# Patient Record
Sex: Female | Born: 1950 | Race: White | Hispanic: No | Marital: Married | Smoking: Never smoker
Health system: Southern US, Community
[De-identification: ages and names within clinical notes are randomized; demographics above are authoritative.]

## PROBLEM LIST (undated history)

## (undated) DIAGNOSIS — Z8589 Personal history of malignant neoplasm of other organs and systems: Secondary | ICD-10-CM

## (undated) DIAGNOSIS — R42 Dizziness and giddiness: Secondary | ICD-10-CM

## (undated) DIAGNOSIS — L570 Actinic keratosis: Secondary | ICD-10-CM

## (undated) DIAGNOSIS — D369 Benign neoplasm, unspecified site: Secondary | ICD-10-CM

## (undated) DIAGNOSIS — Z973 Presence of spectacles and contact lenses: Secondary | ICD-10-CM

## (undated) DIAGNOSIS — E782 Mixed hyperlipidemia: Secondary | ICD-10-CM

## (undated) DIAGNOSIS — I10 Essential (primary) hypertension: Secondary | ICD-10-CM

## (undated) DIAGNOSIS — J4520 Mild intermittent asthma, uncomplicated: Secondary | ICD-10-CM

## (undated) DIAGNOSIS — I4510 Unspecified right bundle-branch block: Secondary | ICD-10-CM

## (undated) DIAGNOSIS — E538 Deficiency of other specified B group vitamins: Secondary | ICD-10-CM

## (undated) DIAGNOSIS — J45909 Unspecified asthma, uncomplicated: Secondary | ICD-10-CM

## (undated) DIAGNOSIS — H9191 Unspecified hearing loss, right ear: Secondary | ICD-10-CM

## (undated) HISTORY — PX: COLONOSCOPY: SHX174

## (undated) HISTORY — DX: Actinic keratosis: L57.0

## (undated) HISTORY — PX: TUBAL LIGATION: SHX77

## (undated) HISTORY — DX: Personal history of malignant neoplasm of other organs and systems: Z85.89

---

## 2005-07-10 ENCOUNTER — Ambulatory Visit: Payer: Self-pay | Admitting: Internal Medicine

## 2006-07-30 ENCOUNTER — Ambulatory Visit: Payer: Self-pay | Admitting: Internal Medicine

## 2007-01-17 ENCOUNTER — Ambulatory Visit: Payer: Self-pay | Admitting: Gastroenterology

## 2007-08-28 ENCOUNTER — Ambulatory Visit: Payer: Self-pay | Admitting: Internal Medicine

## 2008-10-12 ENCOUNTER — Ambulatory Visit: Payer: Self-pay | Admitting: Internal Medicine

## 2009-10-27 ENCOUNTER — Ambulatory Visit: Payer: Self-pay | Admitting: Internal Medicine

## 2010-10-31 ENCOUNTER — Ambulatory Visit: Payer: Self-pay | Admitting: Internal Medicine

## 2012-04-04 ENCOUNTER — Ambulatory Visit: Payer: Self-pay | Admitting: Internal Medicine

## 2012-06-11 DIAGNOSIS — Z8589 Personal history of malignant neoplasm of other organs and systems: Secondary | ICD-10-CM

## 2012-06-11 HISTORY — DX: Personal history of malignant neoplasm of other organs and systems: Z85.89

## 2012-07-15 ENCOUNTER — Ambulatory Visit: Payer: Self-pay | Admitting: Gastroenterology

## 2013-04-07 ENCOUNTER — Ambulatory Visit: Payer: Self-pay | Admitting: Internal Medicine

## 2014-04-08 ENCOUNTER — Ambulatory Visit: Payer: Self-pay | Admitting: Internal Medicine

## 2015-03-10 ENCOUNTER — Other Ambulatory Visit: Payer: Self-pay | Admitting: Internal Medicine

## 2015-03-10 DIAGNOSIS — Z1231 Encounter for screening mammogram for malignant neoplasm of breast: Secondary | ICD-10-CM

## 2015-04-11 ENCOUNTER — Ambulatory Visit
Admission: RE | Admit: 2015-04-11 | Discharge: 2015-04-11 | Disposition: A | Discharge status: Home / Self Care | Payer: PPO | Source: Ambulatory Visit | Attending: Internal Medicine | Admitting: Internal Medicine

## 2015-04-11 DIAGNOSIS — Z1231 Encounter for screening mammogram for malignant neoplasm of breast: Secondary | ICD-10-CM | POA: Diagnosis not present

## 2015-05-10 ENCOUNTER — Emergency Department
Admission: EM | Admit: 2015-05-10 | Discharge: 2015-05-11 | Disposition: A | Discharge status: Home / Self Care | Payer: PPO | Attending: Emergency Medicine | Admitting: Emergency Medicine

## 2015-05-10 ENCOUNTER — Encounter: Payer: Self-pay | Admitting: *Deleted

## 2015-05-10 ENCOUNTER — Emergency Department: Payer: PPO

## 2015-05-10 ENCOUNTER — Other Ambulatory Visit: Payer: Self-pay

## 2015-05-10 DIAGNOSIS — R0602 Shortness of breath: Secondary | ICD-10-CM | POA: Insufficient documentation

## 2015-05-10 DIAGNOSIS — I10 Essential (primary) hypertension: Secondary | ICD-10-CM | POA: Diagnosis not present

## 2015-05-10 DIAGNOSIS — R0600 Dyspnea, unspecified: Secondary | ICD-10-CM | POA: Diagnosis present

## 2015-05-10 LAB — CBC WITH DIFFERENTIAL/PLATELET
BASOS ABS: 0 10*3/uL (ref 0–0.1)
Basophils Relative: 0 %
EOS ABS: 0.1 10*3/uL (ref 0–0.7)
Eosinophils Relative: 1 %
HEMATOCRIT: 39.5 % (ref 35.0–47.0)
Hemoglobin: 13.5 g/dL (ref 12.0–16.0)
LYMPHS ABS: 1.9 10*3/uL (ref 1.0–3.6)
Lymphocytes Relative: 20 %
MCH: 28.2 pg (ref 26.0–34.0)
MCHC: 34.2 g/dL (ref 32.0–36.0)
MCV: 82.3 fL (ref 80.0–100.0)
MONO ABS: 0.6 10*3/uL (ref 0.2–0.9)
MONOS PCT: 7 %
NEUTROS PCT: 72 %
Neutro Abs: 6.8 10*3/uL — ABNORMAL HIGH (ref 1.4–6.5)
PLATELETS: 189 10*3/uL (ref 150–440)
RBC: 4.8 MIL/uL (ref 3.80–5.20)
RDW: 14.1 % (ref 11.5–14.5)
WBC: 9.5 10*3/uL (ref 3.6–11.0)

## 2015-05-10 LAB — COMPREHENSIVE METABOLIC PANEL
ALT: 21 U/L (ref 14–54)
AST: 20 U/L (ref 15–41)
Albumin: 4.3 g/dL (ref 3.5–5.0)
Alkaline Phosphatase: 64 U/L (ref 38–126)
Anion gap: 9 (ref 5–15)
BUN: 23 mg/dL — ABNORMAL HIGH (ref 6–20)
CO2: 24 mmol/L (ref 22–32)
CREATININE: 0.92 mg/dL (ref 0.44–1.00)
Calcium: 9 mg/dL (ref 8.9–10.3)
Chloride: 105 mmol/L (ref 101–111)
GFR calc Af Amer: >60 mL/min (ref >60)
Glucose, Bld: 113 mg/dL — ABNORMAL HIGH (ref 65–99)
POTASSIUM: 3.9 mmol/L (ref 3.5–5.1)
Sodium: 138 mmol/L (ref 135–145)
Total Bilirubin: 0.6 mg/dL (ref 0.3–1.2)
Total Protein: 6.8 g/dL (ref 6.5–8.1)

## 2015-05-10 LAB — FIBRIN DERIVATIVES D-DIMER (ARMC ONLY): Fibrin derivatives D-dimer (ARMC): 389 (ref 0–499)

## 2015-05-10 LAB — TROPONIN I

## 2015-05-10 MED ORDER — ALBUTEROL SULFATE (2.5 MG/3ML) 0.083% IN NEBU
5.0000 mg | INHALATION_SOLUTION | Freq: Once | RESPIRATORY_TRACT | Status: AC
  Administered 2015-05-10: 2.5 mg via RESPIRATORY_TRACT

## 2015-05-10 MED ORDER — IPRATROPIUM-ALBUTEROL 18-103 MCG/ACT IN AERO: 2.0000 | INHALATION_SPRAY | Freq: Four times a day (QID) | RESPIRATORY_TRACT | Status: DC

## 2015-05-10 NOTE — ED Notes (Signed)
Pt ambulatory to triage.  Pt states twice today she had episode of diff breathing .Marland Kitchen States it feels like a blanket over over me and i can't breathe.  No chest pain.  Denies numbness.  No weakness.  No n/v/d.   Pt reports that i just don't feel well today.

## 2015-05-10 NOTE — ED Provider Notes (Addendum)
CSN: [id number]     Arrival date & time 05/10/15  2114 History   First MD Initiated Contact with Patient 05/10/15 2129     Chief Complaint  Patient presents with  . Respiratory Distress     (Consider location/radiation/quality/duration/timing/severity/associated sxs/prior Treatment) The history is provided by the patient.  Allison Shelton is a 64 y.o. female history of hypertension here presenting with shortness of breath. Patient states that she recently drove back from [place]. Patient states that she had 2 episode with she states that she can't catch her breath today. During dinnertime, she feels full and doesn't want to eat. Has some shortness of breath that lasted several minutes. Denies any chest pain. No history of PE or DVT. Denies any cardiac history. Had similar symptoms several years ago and was given inhaler and felt better.    No past medical history on file. No past surgical history on file. No family history on file. History  Substance Use Topics  . Smoking status: Never Smoker   . Smokeless tobacco: Not on file  . Alcohol Use: No   OB History    No data available     Review of Systems  Respiratory: Positive for shortness of breath.   All other systems reviewed and are negative.     Allergies  Morphine and related; Penicillins; and Sulfa antibiotics  Home Medications   Prior to Admission medications   Not on File   BP 136/70 mmHg  Pulse 70  Temp(Src) 98.3 F (36.8 C) (Oral)  Resp 20  Ht 4\' 11"  (1.499 m)  Wt 190 lb (86.183 kg)  BMI 38.35 kg/m2  SpO2 99% Physical Exam  Constitutional: She is oriented to person, place, and time. She appears well-developed and well-nourished.  HENT:  Head: Normocephalic.  Mouth/Throat: Oropharynx is clear and moist.  Eyes: Conjunctivae are normal. Pupils are equal, round, and reactive to light.  Neck: Normal range of motion. Neck supple.  Cardiovascular: Normal rate, regular rhythm and normal heart sounds.    Pulmonary/Chest: Effort normal and breath sounds normal.  Dec air movement. ? Minimal wheezing, no retractions  Abdominal: Soft. Bowel sounds are normal. She exhibits no distension. There is no tenderness. There is no rebound.  Musculoskeletal: Normal range of motion. She exhibits no edema or tenderness.  Neurological: She is alert and oriented to person, place, and time. No cranial nerve deficit. Coordination normal.  Skin: Skin is warm and dry.  Psychiatric: She has a normal mood and affect. Her behavior is normal. Judgment and thought content normal.  Nursing note and vitals reviewed.   ED Course  Procedures (including critical care time) Labs Review Labs Reviewed  CBC WITH DIFFERENTIAL/PLATELET - Abnormal; Notable for the following:    Neutro Abs 6.8 (*)    All other components within normal limits  COMPREHENSIVE METABOLIC PANEL - Abnormal; Notable for the following:    Glucose, Bld 113 (*)    BUN 23 (*)    All other components within normal limits  TROPONIN I  FIBRIN DERIVATIVES D-DIMER [facility] ONLY)    Imaging Review Dg Chest 2 View  05/10/2015   CLINICAL DATA:  Difficulty taking a deep breath since noon today  EXAM: CHEST  2 VIEW  COMPARISON:  None.  FINDINGS: The heart size and mediastinal contours are within normal limits. Both lungs are clear. The visualized skeletal structures are unremarkable.  IMPRESSION: No active cardiopulmonary disease.   Electronically Signed   By: Skipper Cliche M.D.   On:  05/10/2015 22:04     EKG Interpretation None       ED ECG REPORT   Date: 05/10/2015  EKG Time: 11:10 PM  Rate: 70  Rhythm: normal sinus rhythm,  normal EKG, normal sinus rhythm  Axis: normal  Intervals:none  ST&T Change: none  Narrative Interpretation:              MDM   Final diagnoses:  None    Allison Shelton is a 64 y.o. female here with shortness of breath. Consider asthma vs PE (given recent travel). No chest pain and I doubt ACS. Will get labs,  d-dimer, CXR. Will give neb and reassess.   11:10 PM CXR nl, labs unremarkable. D-dimer pending. Signed out to Dr. Owens Shark. Anticipate Dc if d-dimer neg. Can dc home with albuterol prn.     Wandra Arthurs, MD 05/10/15 Braymer Maxyne Derocher, MD 05/10/15 [phone]

## 2015-05-10 NOTE — ED Notes (Signed)
Patient presents with no real complaints other than "I am just not feeling myself." Patient states, "I have no real symptoms. I dont know how to describe it. I just feel strange." Patient denies CP, SOB, and ABD pain. Patient goes on to request to have "at least my blood pressure and blood sugar checked". NAD noted at this time.

## 2015-05-10 NOTE — Discharge Instructions (Signed)
Take albuterol as needed for cough or shortness of breath.   Follow up with your doctor.  Return to ER if you have worse shortness of breath, cough, wheezing, chest pain.

## 2015-05-11 NOTE — ED Provider Notes (Signed)
I assumed care of the patient at 11:00 PM from Dr. Darl Householder. D-dimer 389 which is normal patient's rest or distress resolved. We will discharge home as per recommendations from Dr. Darl Householder.  Gregor Hams, MD 05/11/15 [phone]

## 2015-10-27 DIAGNOSIS — Z85828 Personal history of other malignant neoplasm of skin: Secondary | ICD-10-CM | POA: Diagnosis not present

## 2015-10-27 DIAGNOSIS — L821 Other seborrheic keratosis: Secondary | ICD-10-CM | POA: Diagnosis not present

## 2015-10-27 DIAGNOSIS — L820 Inflamed seborrheic keratosis: Secondary | ICD-10-CM | POA: Diagnosis not present

## 2016-03-05 DIAGNOSIS — R739 Hyperglycemia, unspecified: Secondary | ICD-10-CM | POA: Diagnosis not present

## 2016-03-05 DIAGNOSIS — Z0000 Encounter for general adult medical examination without abnormal findings: Secondary | ICD-10-CM | POA: Diagnosis not present

## 2016-03-12 DIAGNOSIS — Z0000 Encounter for general adult medical examination without abnormal findings: Secondary | ICD-10-CM | POA: Diagnosis not present

## 2016-03-13 ENCOUNTER — Other Ambulatory Visit: Payer: Self-pay | Admitting: Internal Medicine

## 2016-03-13 DIAGNOSIS — Z1231 Encounter for screening mammogram for malignant neoplasm of breast: Secondary | ICD-10-CM

## 2016-04-11 ENCOUNTER — Ambulatory Visit
Admission: RE | Admit: 2016-04-11 | Discharge: 2016-04-11 | Disposition: A | Discharge status: Home / Self Care | Payer: PPO | Source: Ambulatory Visit | Attending: Internal Medicine | Admitting: Internal Medicine

## 2016-04-11 ENCOUNTER — Other Ambulatory Visit: Payer: Self-pay | Admitting: Internal Medicine

## 2016-04-11 DIAGNOSIS — Z1231 Encounter for screening mammogram for malignant neoplasm of breast: Secondary | ICD-10-CM

## 2016-04-16 ENCOUNTER — Other Ambulatory Visit: Payer: Self-pay | Admitting: Internal Medicine

## 2016-04-16 DIAGNOSIS — R928 Other abnormal and inconclusive findings on diagnostic imaging of breast: Secondary | ICD-10-CM

## 2016-05-02 ENCOUNTER — Ambulatory Visit
Admission: RE | Admit: 2016-05-02 | Discharge: 2016-05-02 | Disposition: A | Discharge status: Home / Self Care | Payer: PPO | Source: Ambulatory Visit | Attending: Internal Medicine | Admitting: Internal Medicine

## 2016-05-02 DIAGNOSIS — N6489 Other specified disorders of breast: Secondary | ICD-10-CM | POA: Diagnosis not present

## 2016-05-02 DIAGNOSIS — R928 Other abnormal and inconclusive findings on diagnostic imaging of breast: Secondary | ICD-10-CM | POA: Insufficient documentation

## 2016-07-20 ENCOUNTER — Other Ambulatory Visit (HOSPITAL_COMMUNITY): Payer: Self-pay | Admitting: Podiatry

## 2016-07-20 ENCOUNTER — Other Ambulatory Visit: Payer: Self-pay | Admitting: Podiatry

## 2016-07-20 DIAGNOSIS — M79671 Pain in right foot: Secondary | ICD-10-CM | POA: Diagnosis not present

## 2016-07-20 DIAGNOSIS — D489 Neoplasm of uncertain behavior, unspecified: Secondary | ICD-10-CM | POA: Diagnosis not present

## 2016-08-02 ENCOUNTER — Ambulatory Visit: Payer: PPO

## 2016-08-06 DIAGNOSIS — Z85828 Personal history of other malignant neoplasm of skin: Secondary | ICD-10-CM | POA: Diagnosis not present

## 2016-08-06 DIAGNOSIS — D1800 Hemangioma unspecified site: Secondary | ICD-10-CM | POA: Diagnosis not present

## 2016-08-06 DIAGNOSIS — L578 Other skin changes due to chronic exposure to nonionizing radiation: Secondary | ICD-10-CM | POA: Diagnosis not present

## 2016-08-06 DIAGNOSIS — D229 Melanocytic nevi, unspecified: Secondary | ICD-10-CM | POA: Diagnosis not present

## 2016-08-06 DIAGNOSIS — L812 Freckles: Secondary | ICD-10-CM | POA: Diagnosis not present

## 2016-08-06 DIAGNOSIS — L821 Other seborrheic keratosis: Secondary | ICD-10-CM | POA: Diagnosis not present

## 2016-08-06 DIAGNOSIS — Z1283 Encounter for screening for malignant neoplasm of skin: Secondary | ICD-10-CM | POA: Diagnosis not present

## 2016-08-06 DIAGNOSIS — I8393 Asymptomatic varicose veins of bilateral lower extremities: Secondary | ICD-10-CM | POA: Diagnosis not present

## 2016-08-06 DIAGNOSIS — L820 Inflamed seborrheic keratosis: Secondary | ICD-10-CM | POA: Diagnosis not present

## 2016-08-07 DIAGNOSIS — H8111 Benign paroxysmal vertigo, right ear: Secondary | ICD-10-CM | POA: Diagnosis not present

## 2016-08-13 ENCOUNTER — Ambulatory Visit
Admission: RE | Admit: 2016-08-13 | Discharge: 2016-08-13 | Disposition: A | Discharge status: Home / Self Care | Payer: PPO | Source: Ambulatory Visit | Attending: Podiatry | Admitting: Podiatry

## 2016-08-13 DIAGNOSIS — S96811A Strain of other specified muscles and tendons at ankle and foot level, right foot, initial encounter: Secondary | ICD-10-CM | POA: Diagnosis not present

## 2016-08-13 DIAGNOSIS — X58XXXA Exposure to other specified factors, initial encounter: Secondary | ICD-10-CM | POA: Diagnosis not present

## 2016-08-13 DIAGNOSIS — D489 Neoplasm of uncertain behavior, unspecified: Secondary | ICD-10-CM | POA: Diagnosis not present

## 2016-08-13 DIAGNOSIS — S86311A Strain of muscle(s) and tendon(s) of peroneal muscle group at lower leg level, right leg, initial encounter: Secondary | ICD-10-CM | POA: Diagnosis not present

## 2016-08-13 MED ORDER — GADOBENATE DIMEGLUMINE 529 MG/ML IV SOLN
15.0000 mL | Freq: Once | INTRAVENOUS | Status: AC | PRN
  Administered 2016-08-13: 15 mL via INTRAVENOUS

## 2016-08-20 DIAGNOSIS — Z23 Encounter for immunization: Secondary | ICD-10-CM | POA: Diagnosis not present

## 2016-08-23 DIAGNOSIS — D489 Neoplasm of uncertain behavior, unspecified: Secondary | ICD-10-CM | POA: Diagnosis not present

## 2016-08-29 NOTE — Discharge Instructions (Signed)
[facility] °[facility] ° °POST OPERATIVE INSTRUCTIONS FOR DR. TROXLER AND DR. FOWLER °[facility] ° ° °1. Take your medication as prescribed.  Pain medication should be taken only as needed. ° °2. Keep the dressing clean, dry and intact. ° °3. Keep your foot elevated above the heart level for the first 48 hours. ° °4. Walking to the bathroom and brief periods of walking are acceptable, unless we have instructed you to be non-weight bearing. ° °5. Always wear your post-op shoe when walking.  Always use your crutches if you are to be non-weight bearing. ° °6. Do not take a shower. Baths are permissible as long as the foot is kept out of the water.  ° °7. Every hour you are awake:  °- Bend your knee 15 times. °- Flex foot 15 times °- Massage calf 15 times ° °8. Call [facility] ([phone]) if any of the following problems occur: °- You develop a temperature or fever. °- The bandage becomes saturated with blood. °- Medication does not stop your pain. °- Injury of the foot occurs. °- Any symptoms of infection including redness, odor, or red streaks running from wound. ° °General Anesthesia, Adult, Care After °Refer to this sheet in the next few weeks. These instructions provide you with information on caring for yourself after your procedure. Your health care provider may also give you more specific instructions. Your treatment has been planned according to current medical practices, but problems sometimes occur. Call your health care provider if you have any problems or questions after your procedure. °WHAT TO EXPECT AFTER THE PROCEDURE °After the procedure, it is typical to experience: °· Sleepiness. °· Nausea and vomiting. °HOME CARE INSTRUCTIONS °· For the first 24 hours after general anesthesia: °¨ Have a responsible person with you. °¨ Do not drive a car. If you are alone, do not take public transportation. °¨ Do not drink alcohol. °¨ Do not take  medicine that has not been prescribed by your health care provider. °¨ Do not sign important papers or make important decisions. °¨ You may resume a normal diet and activities as directed by your health care provider. °· Change bandages (dressings) as directed. °· If you have questions or problems that seem related to general anesthesia, call the hospital and ask for the anesthetist or anesthesiologist on call. °SEEK MEDICAL CARE IF: °· You have nausea and vomiting that continue the day after anesthesia. °· You develop a rash. °SEEK IMMEDIATE MEDICAL CARE IF:  °· You have difficulty breathing. °· You have chest pain. °· You have any allergic problems. °  °This information is not intended to replace advice given to you by your health care provider. Make sure you discuss any questions you have with your health care provider. °  °Document Released: 01/14/2001 Document Revised: 10/29/2014 Document Reviewed: 02/06/2012 °Elsevier Interactive Patient Education ©2016 [employer] ° °

## 2016-08-30 ENCOUNTER — Encounter: Payer: Self-pay | Admitting: *Deleted

## 2016-09-05 ENCOUNTER — Encounter
Admission: RE | Disposition: A | Discharge status: Home / Self Care | Payer: Self-pay | Source: Ambulatory Visit | Attending: Podiatry

## 2016-09-05 ENCOUNTER — Ambulatory Visit: Payer: PPO | Admitting: Anesthesiology

## 2016-09-05 ENCOUNTER — Ambulatory Visit
Admission: RE | Admit: 2016-09-05 | Discharge: 2016-09-05 | Disposition: A | Discharge status: Home / Self Care | Payer: PPO | Source: Ambulatory Visit | Attending: Podiatry | Admitting: Podiatry

## 2016-09-05 DIAGNOSIS — D489 Neoplasm of uncertain behavior, unspecified: Secondary | ICD-10-CM | POA: Diagnosis not present

## 2016-09-05 DIAGNOSIS — D2121 Benign neoplasm of connective and other soft tissue of right lower limb, including hip: Secondary | ICD-10-CM | POA: Diagnosis not present

## 2016-09-05 DIAGNOSIS — R2242 Localized swelling, mass and lump, left lower limb: Secondary | ICD-10-CM | POA: Diagnosis not present

## 2016-09-05 DIAGNOSIS — I10 Essential (primary) hypertension: Secondary | ICD-10-CM | POA: Insufficient documentation

## 2016-09-05 DIAGNOSIS — D492 Neoplasm of unspecified behavior of bone, soft tissue, and skin: Secondary | ICD-10-CM | POA: Diagnosis not present

## 2016-09-05 DIAGNOSIS — Z79899 Other long term (current) drug therapy: Secondary | ICD-10-CM | POA: Diagnosis not present

## 2016-09-05 DIAGNOSIS — H9191 Unspecified hearing loss, right ear: Secondary | ICD-10-CM | POA: Insufficient documentation

## 2016-09-05 DIAGNOSIS — M12271 Villonodular synovitis (pigmented), right ankle and foot: Secondary | ICD-10-CM | POA: Diagnosis not present

## 2016-09-05 HISTORY — PX: MASS EXCISION: SHX2000

## 2016-09-05 HISTORY — DX: Presence of spectacles and contact lenses: Z97.3

## 2016-09-05 HISTORY — DX: Unspecified hearing loss, right ear: H91.91

## 2016-09-05 HISTORY — DX: Essential (primary) hypertension: I10

## 2016-09-05 HISTORY — DX: Dizziness and giddiness: R42

## 2016-09-05 SURGERY — EXCISION MASS
Anesthesia: Monitor Anesthesia Care | Laterality: Right

## 2016-09-05 MED ORDER — PROPOFOL 500 MG/50ML IV EMUL
INTRAVENOUS | Status: DC | PRN
  Administered 2016-09-05: 50 ug/kg/min via INTRAVENOUS

## 2016-09-05 MED ORDER — OXYCODONE HCL 5 MG/5ML PO SOLN: 5.0000 mg | Freq: Once | ORAL | Status: DC | PRN

## 2016-09-05 MED ORDER — ONDANSETRON HCL 4 MG PO TABS: 4.0000 mg | ORAL_TABLET | Freq: Four times a day (QID) | ORAL | Status: DC | PRN

## 2016-09-05 MED ORDER — HYDROCODONE-ACETAMINOPHEN 5-325 MG PO TABS: 1.0000 | ORAL_TABLET | Freq: Four times a day (QID) | ORAL | 0 refills | Status: DC | PRN

## 2016-09-05 MED ORDER — LIDOCAINE HCL (CARDIAC) 20 MG/ML IV SOLN
INTRAVENOUS | Status: DC | PRN
  Administered 2016-09-05: 50 mg via INTRAVENOUS

## 2016-09-05 MED ORDER — OXYCODONE HCL 5 MG PO TABS: 5.0000 mg | ORAL_TABLET | Freq: Once | ORAL | Status: DC | PRN

## 2016-09-05 MED ORDER — MIDAZOLAM HCL 2 MG/2ML IJ SOLN
INTRAMUSCULAR | Status: DC | PRN
  Administered 2016-09-05: 2 mg via INTRAVENOUS

## 2016-09-05 MED ORDER — ONDANSETRON HCL 4 MG/2ML IJ SOLN: 4.0000 mg | Freq: Four times a day (QID) | INTRAMUSCULAR | Status: DC | PRN

## 2016-09-05 MED ORDER — BUPIVACAINE HCL (PF) 0.5 % IJ SOLN
INTRAMUSCULAR | Status: DC | PRN
  Administered 2016-09-05: 10 mL

## 2016-09-05 MED ORDER — FENTANYL CITRATE (PF) 100 MCG/2ML IJ SOLN
INTRAMUSCULAR | Status: DC | PRN
  Administered 2016-09-05 (×2): 50 ug via INTRAVENOUS

## 2016-09-05 MED ORDER — ONDANSETRON HCL 4 MG/2ML IJ SOLN: 4.0000 mg | Freq: Once | INTRAMUSCULAR | Status: DC | PRN

## 2016-09-05 MED ORDER — LIDOCAINE-EPINEPHRINE 1 %-1:100000 IJ SOLN
INTRAMUSCULAR | Status: DC | PRN
  Administered 2016-09-05: 5 mL

## 2016-09-05 MED ORDER — LACTATED RINGERS IV SOLN
INTRAVENOUS | Status: DC
  Administered 2016-09-05: 11:00:00 via INTRAVENOUS

## 2016-09-05 MED ORDER — HYDROCODONE-ACETAMINOPHEN 5-325 MG PO TABS: 1.0000 | ORAL_TABLET | ORAL | Status: DC | PRN

## 2016-09-05 MED ORDER — FENTANYL CITRATE (PF) 100 MCG/2ML IJ SOLN: 25.0000 ug | INTRAMUSCULAR | Status: DC | PRN

## 2016-09-05 SURGICAL SUPPLY — 37 items
BENZOIN TINCTURE PRP APPL 2/3 (GAUZE/BANDAGES/DRESSINGS) ×2 IMPLANT
BNDG ESMARK 6X12 TAN STRL LF (GAUZE/BANDAGES/DRESSINGS) ×2 IMPLANT
BNDG GAUZE 4.5X4.1 6PLY STRL (MISCELLANEOUS) ×2 IMPLANT
BNDG STRETCH 4X75 STRL LF (GAUZE/BANDAGES/DRESSINGS) ×2 IMPLANT
CANISTER SUCT 1200ML W/VALVE (MISCELLANEOUS) ×2 IMPLANT
CUFF TOURN SGL QUICK 18 (TOURNIQUET CUFF) IMPLANT
DURAPREP 26ML APPLICATOR (WOUND CARE) ×2 IMPLANT
GAUZE PETRO XEROFOAM 1X8 (MISCELLANEOUS) ×2 IMPLANT
GAUZE PETRO XEROFOAM 5X9 (MISCELLANEOUS) IMPLANT
GAUZE SPONGE 4X4 12PLY STRL (GAUZE/BANDAGES/DRESSINGS) ×2 IMPLANT
GLOVE BIO SURGEON STRL SZ7.5 (GLOVE) ×2 IMPLANT
GLOVE INDICATOR 8.0 STRL GRN (GLOVE) ×2 IMPLANT
GOWN STRL REUS W/ TWL LRG LVL3 (GOWN DISPOSABLE) ×2 IMPLANT
GOWN STRL REUS W/TWL LRG LVL3 (GOWN DISPOSABLE) ×2
KIT ROOM TURNOVER OR (KITS) ×2 IMPLANT
NS IRRIG 500ML POUR BTL (IV SOLUTION) ×2 IMPLANT
PACK EXTREMITY ARMC (MISCELLANEOUS) ×2 IMPLANT
PAD GROUND ADULT SPLIT (MISCELLANEOUS) ×2 IMPLANT
STOCKINETTE STRL 6IN 960660 (GAUZE/BANDAGES/DRESSINGS) ×2 IMPLANT
STRIP CLOSURE SKIN 1/4X4 (GAUZE/BANDAGES/DRESSINGS) ×2 IMPLANT
SUT ETHILON 4-0 (SUTURE) ×1
SUT ETHILON 4-0 FS2 18XMFL BLK (SUTURE) ×1
SUT ETHILON 5-0 FS-2 18 BLK (SUTURE) IMPLANT
SUT MNCRL 4-0 (SUTURE)
SUT MNCRL 4-0 27XMFL (SUTURE)
SUT MNCRL 5-0+ PC-1 (SUTURE) IMPLANT
SUT MONOCRYL 5-0 (SUTURE)
SUT PDS 2-0 27IN (SUTURE) ×2 IMPLANT
SUT VIC AB 0 CT1 36 (SUTURE) IMPLANT
SUT VIC AB 2-0 SH 27 (SUTURE)
SUT VIC AB 2-0 SH 27XBRD (SUTURE) IMPLANT
SUT VIC AB 3-0 SH 27 (SUTURE)
SUT VIC AB 3-0 SH 27X BRD (SUTURE) IMPLANT
SUT VIC AB 4-0 FS2 27 (SUTURE) ×2 IMPLANT
SUT VICRYL AB 3-0 FS1 BRD 27IN (SUTURE) IMPLANT
SUTURE ETHLN 4-0 FS2 18XMF BLK (SUTURE) ×1 IMPLANT
SUTURE MNCRL 4-0 27XMF (SUTURE) IMPLANT

## 2016-09-05 NOTE — H&P (Signed)
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  The various methods of treatment have been discussed with the patient.  No guarantees were given.  After consideration of risks, benefits and other options for treatment, the patient has consented to surgery.  I have reviewed the patients' chart and labs.    Patient Vitals for the past 24 hrs:  BP Temp Temp src Pulse Resp SpO2 Height Weight  09/05/16 1108 (!) 163/68 97.8 F (36.6 C) Temporal 73 16 98 % 4\' 11"  (1.499 m) 77.1 kg (170 lb)    A history and physical examination was performed in my office.  The patient was reexamined.  There have been no changes to this history and physical examination.  Samara Deist A

## 2016-09-05 NOTE — Transfer of Care (Signed)
Immediate Anesthesia Transfer of Care Note  Patient: Allison Shelton  Procedure(s) Performed: Procedure(s) with comments: EXCISION MASS BIOPSY DEEP RIGHT ANKLE (Right) - IV WITH LOCAL  Patient Location: PACU  Anesthesia Type: MAC  Level of Consciousness: awake, alert  and patient cooperative  Airway and Oxygen Therapy: Patient Spontanous Breathing and Patient connected to supplemental oxygen  Post-op Assessment: Post-op Vital signs reviewed, Patient's Cardiovascular Status Stable, Respiratory Function Stable, Patent Airway and No signs of Nausea or vomiting  Post-op Vital Signs: Reviewed and stable  Complications: No apparent anesthesia complications

## 2016-09-05 NOTE — Anesthesia Postprocedure Evaluation (Signed)
Anesthesia Post Note  Patient: Allison Shelton  Procedure(s) Performed: Procedure(s) (LRB): EXCISION MASS BIOPSY DEEP RIGHT ANKLE (Right)  Patient location during evaluation: PACU Anesthesia Type: MAC Level of consciousness: awake and alert Pain management: pain level controlled Vital Signs Assessment: post-procedure vital signs reviewed and stable Respiratory status: spontaneous breathing, nonlabored ventilation, respiratory function stable and patient connected to nasal cannula oxygen Cardiovascular status: stable and blood pressure returned to baseline Anesthetic complications: no    Alisa Graff

## 2016-09-05 NOTE — Op Note (Signed)
Operative note   Surgeon:Jermane Brayboy    Assistant:none    Preop diagnosis:soft tissue mass right lateral ankle    Postop diagnosis:same    Procedure:open biopsy soft tissue mass posterio-lateral right ankle    XO:[id number]     Anesthesia:local and IV sedation    Hemostasis: Epinephrine 1 100,000 infiltrated along the incision site    Specimen: Incisional biopsy soft tissue mass posterior lateral right ankle    Complications: None    Operative indications:Allison Shelton is an 65 y.o. that presents today for surgical intervention.  The risks/benefits/alternatives/complications have been discussed and consent has been given.    Procedure:  Patient was brought into the OR and placed on the operating table in thesupine position. After anesthesia was obtained theright lower extremity was prepped and draped in usual sterile fashion.  After sterile prep and drape attention was directed to the posterior lateral aspect of the right ankle where a longitudinal incision was made overlying the palpable soft tissue mass. Sharp and blunt dissection carried down to the mass. This looks to be a well encapsulated mass overlying the peroneal tendon regions. The wedge was created within the mass and removed from the surgical field in toto. Bleeders were Bovie cauterized at this time. A 2-0 PDS was placed on the proximal and distal aspect of the incisional site. The capsule was closed with a 4-0 Vicryl and the subcutaneous tissue closed with a 4-0 Vicryl. The skin closed with a 4-0 nylon. A well compressive sterile dressing was applied after placement of 0.5% Marcaine.    Patient tolerated the procedure and anesthesia well.  Was transported from the OR to the PACU with all vital signs stable and vascular status intact. To be discharged per routine protocol.  Will follow up in approximately 1 week in the outpatient clinic.

## 2016-09-05 NOTE — Anesthesia Preprocedure Evaluation (Signed)
Anesthesia Evaluation  Patient identified by MRN, date of birth, ID band Patient awake    Reviewed: Allergy & Precautions, H&P , NPO status , Patient's Chart, lab work & pertinent test results, reviewed documented beta blocker date and time   Airway Mallampati: II  TM Distance: >3 FB Neck ROM: full    Dental no notable dental hx.    Pulmonary neg pulmonary ROS,    Pulmonary exam normal breath sounds clear to auscultation       Cardiovascular Exercise Tolerance: Good hypertension,  Rhythm:regular Rate:Normal     Neuro/Psych Hard of hearing -right ear negative psych ROS   GI/Hepatic negative GI ROS, Neg liver ROS,   Endo/Other  negative endocrine ROS  Renal/GU negative Renal ROS  negative genitourinary   Musculoskeletal   Abdominal   Peds  Hematology negative hematology ROS (+)   Anesthesia Other Findings   Reproductive/Obstetrics negative OB ROS                             Anesthesia Physical Anesthesia Plan  ASA: II  Anesthesia Plan: MAC   Post-op Pain Management:    Induction:   Airway Management Planned:   Additional Equipment:   Intra-op Plan:   Post-operative Plan:   Informed Consent: I have reviewed the patients History and Physical, chart, labs and discussed the procedure including the risks, benefits and alternatives for the proposed anesthesia with the patient or authorized representative who has indicated his/her understanding and acceptance.   Dental Advisory Given  Plan Discussed with: CRNA  Anesthesia Plan Comments:         Anesthesia Quick Evaluation

## 2016-09-06 ENCOUNTER — Encounter: Payer: Self-pay | Admitting: Podiatry

## 2016-09-07 LAB — SURGICAL PATHOLOGY

## 2016-11-20 ENCOUNTER — Encounter: Payer: Self-pay | Admitting: *Deleted

## 2016-11-20 DIAGNOSIS — S86311D Strain of muscle(s) and tendon(s) of peroneal muscle group at lower leg level, right leg, subsequent encounter: Secondary | ICD-10-CM | POA: Diagnosis not present

## 2016-11-20 DIAGNOSIS — M1220 Villonodular synovitis (pigmented), unspecified site: Secondary | ICD-10-CM | POA: Diagnosis not present

## 2016-11-28 ENCOUNTER — Ambulatory Visit
Admission: RE | Admit: 2016-11-28 | Discharge: 2016-11-28 | Disposition: A | Discharge status: Home / Self Care | Payer: PPO | Source: Ambulatory Visit | Attending: Podiatry | Admitting: Podiatry

## 2016-11-28 ENCOUNTER — Ambulatory Visit: Payer: PPO | Admitting: Anesthesiology

## 2016-11-28 ENCOUNTER — Encounter
Admission: RE | Disposition: A | Discharge status: Home / Self Care | Payer: Self-pay | Source: Ambulatory Visit | Attending: Podiatry

## 2016-11-28 DIAGNOSIS — I10 Essential (primary) hypertension: Secondary | ICD-10-CM | POA: Diagnosis not present

## 2016-11-28 DIAGNOSIS — M65871 Other synovitis and tenosynovitis, right ankle and foot: Secondary | ICD-10-CM | POA: Diagnosis not present

## 2016-11-28 DIAGNOSIS — D489 Neoplasm of uncertain behavior, unspecified: Secondary | ICD-10-CM | POA: Diagnosis not present

## 2016-11-28 DIAGNOSIS — M12271 Villonodular synovitis (pigmented), right ankle and foot: Secondary | ICD-10-CM | POA: Diagnosis not present

## 2016-11-28 DIAGNOSIS — M1220 Villonodular synovitis (pigmented), unspecified site: Secondary | ICD-10-CM | POA: Diagnosis not present

## 2016-11-28 DIAGNOSIS — M25571 Pain in right ankle and joints of right foot: Secondary | ICD-10-CM | POA: Diagnosis not present

## 2016-11-28 DIAGNOSIS — M7671 Peroneal tendinitis, right leg: Secondary | ICD-10-CM | POA: Diagnosis not present

## 2016-11-28 DIAGNOSIS — M24871 Other specific joint derangements of right ankle, not elsewhere classified: Secondary | ICD-10-CM | POA: Diagnosis not present

## 2016-11-28 DIAGNOSIS — S86311D Strain of muscle(s) and tendon(s) of peroneal muscle group at lower leg level, right leg, subsequent encounter: Secondary | ICD-10-CM | POA: Diagnosis not present

## 2016-11-28 DIAGNOSIS — G8918 Other acute postprocedural pain: Secondary | ICD-10-CM | POA: Diagnosis not present

## 2016-11-28 HISTORY — PX: MASS EXCISION: SHX2000

## 2016-11-28 HISTORY — PX: TENOTOMY / FLEXOR TENDON TRANSFER: SHX6629

## 2016-11-28 SURGERY — TENOTOMY / FLEXOR TENDON TRANSFER
Anesthesia: Regional | Laterality: Right | Wound class: Clean

## 2016-11-28 MED ORDER — ACETAMINOPHEN 325 MG PO TABS: 325.0000 mg | ORAL_TABLET | ORAL | Status: DC | PRN

## 2016-11-28 MED ORDER — ONDANSETRON HCL 4 MG/2ML IJ SOLN: 4.0000 mg | Freq: Once | INTRAMUSCULAR | Status: DC | PRN

## 2016-11-28 MED ORDER — LIDOCAINE-EPINEPHRINE 2 %-1:100000 IJ SOLN
INTRAMUSCULAR | Status: DC | PRN
  Administered 2016-11-28: 7 mL

## 2016-11-28 MED ORDER — LACTATED RINGERS IV SOLN
INTRAVENOUS | Status: DC
  Administered 2016-11-28: 12:00:00 via INTRAVENOUS

## 2016-11-28 MED ORDER — GLYCOPYRROLATE 0.2 MG/ML IJ SOLN
INTRAMUSCULAR | Status: DC | PRN
  Administered 2016-11-28: 0.1 mg via INTRAVENOUS

## 2016-11-28 MED ORDER — ACETAMINOPHEN 160 MG/5ML PO SOLN: 325.0000 mg | ORAL | Status: DC | PRN

## 2016-11-28 MED ORDER — ONDANSETRON HCL 4 MG PO TABS: 4.0000 mg | ORAL_TABLET | Freq: Four times a day (QID) | ORAL | Status: DC | PRN

## 2016-11-28 MED ORDER — MIDAZOLAM HCL 2 MG/2ML IJ SOLN
INTRAMUSCULAR | Status: DC | PRN
  Administered 2016-11-28 (×2): 1 mg via INTRAVENOUS

## 2016-11-28 MED ORDER — FENTANYL CITRATE (PF) 100 MCG/2ML IJ SOLN
INTRAMUSCULAR | Status: DC | PRN
  Administered 2016-11-28: 50 ug via INTRAVENOUS

## 2016-11-28 MED ORDER — DEXTROSE 5 % IV SOLN
900.0000 mg | Freq: Once | INTRAVENOUS | Status: AC
  Administered 2016-11-28: 900 mg via INTRAVENOUS

## 2016-11-28 MED ORDER — PROPOFOL 10 MG/ML IV BOLUS
INTRAVENOUS | Status: DC | PRN
Start: 2016-11-28 — End: 2016-11-28
  Administered 2016-11-28: 120 mg via INTRAVENOUS

## 2016-11-28 MED ORDER — ONDANSETRON HCL 4 MG/2ML IJ SOLN: 4.0000 mg | Freq: Four times a day (QID) | INTRAMUSCULAR | Status: DC | PRN

## 2016-11-28 MED ORDER — LIDOCAINE HCL (CARDIAC) 20 MG/ML IV SOLN
INTRAVENOUS | Status: DC | PRN
  Administered 2016-11-28: 40 mg via INTRATRACHEAL

## 2016-11-28 MED ORDER — FENTANYL CITRATE (PF) 100 MCG/2ML IJ SOLN: 25.0000 ug | INTRAMUSCULAR | Status: DC | PRN

## 2016-11-28 MED ORDER — OXYCODONE HCL 5 MG PO TABS: 5.0000 mg | ORAL_TABLET | Freq: Once | ORAL | Status: DC | PRN

## 2016-11-28 MED ORDER — ONDANSETRON HCL 4 MG/2ML IJ SOLN
INTRAMUSCULAR | Status: DC | PRN
  Administered 2016-11-28: 4 mg via INTRAVENOUS

## 2016-11-28 MED ORDER — ROPIVACAINE HCL 5 MG/ML IJ SOLN
INTRAMUSCULAR | Status: DC | PRN
  Administered 2016-11-28: 35 mL via PERINEURAL

## 2016-11-28 MED ORDER — OXYCODONE HCL 5 MG/5ML PO SOLN: 5.0000 mg | Freq: Once | ORAL | Status: DC | PRN

## 2016-11-28 MED ORDER — DEXAMETHASONE SODIUM PHOSPHATE 4 MG/ML IJ SOLN
INTRAMUSCULAR | Status: DC | PRN
  Administered 2016-11-28: 4 mg via INTRAVENOUS

## 2016-11-28 SURGICAL SUPPLY — 62 items
BANDAGE ELASTIC 4 LF NS (GAUZE/BANDAGES/DRESSINGS) ×2 IMPLANT
BENZOIN TINCTURE PRP APPL 2/3 (GAUZE/BANDAGES/DRESSINGS) ×2 IMPLANT
BLADE MINI RND TIP GREEN BEAV (BLADE) IMPLANT
BLADE SURG 15 STRL LF DISP TIS (BLADE) IMPLANT
BLADE SURG 15 STRL SS (BLADE)
BNDG COHESIVE 4X5 TAN STRL (GAUZE/BANDAGES/DRESSINGS) ×2 IMPLANT
BNDG ESMARK 4X12 TAN STRL LF (GAUZE/BANDAGES/DRESSINGS) ×2 IMPLANT
BNDG GAUZE 4.5X4.1 6PLY STRL (MISCELLANEOUS) ×2 IMPLANT
BNDG STRETCH 4X75 STRL LF (GAUZE/BANDAGES/DRESSINGS) ×2 IMPLANT
CANISTER SUCT 1200ML W/VALVE (MISCELLANEOUS) ×2 IMPLANT
COVER LIGHT HANDLE UNIVERSAL (MISCELLANEOUS) ×4 IMPLANT
CUFF TOURN SGL QUICK 18 (TOURNIQUET CUFF) ×2 IMPLANT
DURAPREP 26ML APPLICATOR (WOUND CARE) ×2 IMPLANT
ETHIBOND 2 0 GREEN CT 2 30IN (SUTURE) ×2 IMPLANT
GAUZE PETRO XEROFOAM 1X8 (MISCELLANEOUS) ×2 IMPLANT
GAUZE PETRO XEROFOAM 5X9 (MISCELLANEOUS) IMPLANT
GAUZE SPONGE 4X4 12PLY STRL (GAUZE/BANDAGES/DRESSINGS) ×2 IMPLANT
GLOVE BIO SURGEON STRL SZ7.5 (GLOVE) ×2 IMPLANT
GLOVE INDICATOR 8.0 STRL GRN (GLOVE) ×2 IMPLANT
GOWN STRL REUS W/ TWL LRG LVL3 (GOWN DISPOSABLE) ×2 IMPLANT
GOWN STRL REUS W/ TWL XL LVL3 (GOWN DISPOSABLE) ×1 IMPLANT
GOWN STRL REUS W/TWL LRG LVL3 (GOWN DISPOSABLE) ×2
GOWN STRL REUS W/TWL XL LVL3 (GOWN DISPOSABLE) ×1
IV NS 250ML (IV SOLUTION)
IV NS 250ML BAXH (IV SOLUTION) IMPLANT
K-WIRE DBL END TROCAR 6X.045 (WIRE)
K-WIRE DBL END TROCAR 6X.062 (WIRE)
KIT ROOM TURNOVER OR (KITS) ×2 IMPLANT
KWIRE DBL END TROCAR 6X.045 (WIRE) IMPLANT
KWIRE DBL END TROCAR 6X.062 (WIRE) IMPLANT
NS IRRIG 500ML POUR BTL (IV SOLUTION) ×2 IMPLANT
PACK EXTREMITY ARMC (MISCELLANEOUS) ×2 IMPLANT
PAD GROUND ADULT SPLIT (MISCELLANEOUS) ×2 IMPLANT
PADDING CAST BLEND 4X4 NS (MISCELLANEOUS) ×2 IMPLANT
RASP SM TEAR CROSS CUT (RASP) IMPLANT
SPLINT CAST 1 STEP 4X30 (MISCELLANEOUS) IMPLANT
SPLINT FAST PLASTER 5X30 (CAST SUPPLIES)
SPLINT PLASTER CAST FAST 5X30 (CAST SUPPLIES) IMPLANT
STOCKINETTE IMPERVIOUS LG (DRAPES) ×2 IMPLANT
STOCKINETTE STRL 6IN 960660 (GAUZE/BANDAGES/DRESSINGS) ×2 IMPLANT
STRAP BODY AND KNEE 60X3 (MISCELLANEOUS) ×2 IMPLANT
STRIP CLOSURE SKIN 1/4X4 (GAUZE/BANDAGES/DRESSINGS) ×2 IMPLANT
SUT ETHILON 4-0 (SUTURE)
SUT ETHILON 4-0 FS2 18XMFL BLK (SUTURE)
SUT ETHILON 5-0 FS-2 18 BLK (SUTURE) IMPLANT
SUT MNCRL 4-0 (SUTURE)
SUT MNCRL 4-0 27XMFL (SUTURE)
SUT MNCRL 5-0+ PC-1 (SUTURE) IMPLANT
SUT MONOCRYL 5-0 (SUTURE)
SUT SILK 2 0 (SUTURE)
SUT SILK 2-0 30XBRD TIE 12 (SUTURE) IMPLANT
SUT VIC AB 0 CT1 36 (SUTURE) IMPLANT
SUT VIC AB 2-0 SH 27 (SUTURE) ×1
SUT VIC AB 2-0 SH 27XBRD (SUTURE) ×1 IMPLANT
SUT VIC AB 3-0 SH 27 (SUTURE)
SUT VIC AB 3-0 SH 27X BRD (SUTURE) IMPLANT
SUT VIC AB 4-0 FS2 27 (SUTURE) ×2 IMPLANT
SUT VICRYL AB 3-0 FS1 BRD 27IN (SUTURE) IMPLANT
SUTURE ETHLN 4-0 FS2 18XMF BLK (SUTURE) IMPLANT
SUTURE MNCRL 4-0 27XMF (SUTURE) IMPLANT
WAND TENDON TOPAZ 0 ANGL (MISCELLANEOUS) IMPLANT
WAND TOPAZ MICRO DEBRIDER (MISCELLANEOUS) ×2 IMPLANT

## 2016-11-28 NOTE — Anesthesia Postprocedure Evaluation (Addendum)
Anesthesia Post Note  Patient: Allison Shelton  Procedure(s) Performed: Procedure(s) (LRB): FLEXOR TENDON REPAIR--RIGHT GENERAL W/ POPLITEAL (Right) EXCISION--SOFT TISSUE MASS--RIGHT (Right)  Patient location during evaluation: PACU Anesthesia Type: Regional and General Level of consciousness: awake and alert and oriented Pain management: satisfactory to patient Vital Signs Assessment: post-procedure vital signs reviewed and stable Respiratory status: spontaneous breathing, nonlabored ventilation and respiratory function stable Cardiovascular status: blood pressure returned to baseline and stable Postop Assessment: Adequate PO intake and No signs of nausea or vomiting Anesthetic complications: no    Raliegh Ip

## 2016-11-28 NOTE — Anesthesia Preprocedure Evaluation (Signed)
Anesthesia Evaluation  Patient identified by MRN, date of birth, ID band Patient awake    Reviewed: Allergy & Precautions, H&P , NPO status , Patient's Chart, lab work & pertinent test results  Airway Mallampati: II  TM Distance: >3 FB Neck ROM: full    Dental no notable dental hx.    Pulmonary    Pulmonary exam normal        Cardiovascular hypertension, Normal cardiovascular exam     Neuro/Psych    GI/Hepatic   Endo/Other    Renal/GU      Musculoskeletal   Abdominal   Peds  Hematology   Anesthesia Other Findings   Reproductive/Obstetrics                             Anesthesia Physical Anesthesia Plan  ASA: II  Anesthesia Plan: General LMA and Regional   Post-op Pain Management: GA combined w/ Regional for post-op pain   Induction:   Airway Management Planned:   Additional Equipment:   Intra-op Plan:   Post-operative Plan:   Informed Consent: I have reviewed the patients History and Physical, chart, labs and discussed the procedure including the risks, benefits and alternatives for the proposed anesthesia with the patient or authorized representative who has indicated his/her understanding and acceptance.     Plan Discussed with:   Anesthesia Plan Comments:         Anesthesia Quick Evaluation

## 2016-11-28 NOTE — Anesthesia Procedure Notes (Signed)
Procedure Name: LMA Insertion Date/Time: 11/28/2016 12:37 PM Performed by: Mayme Genta Pre-anesthesia Checklist: Patient identified, Emergency Drugs available, Suction available, Timeout performed and Patient being monitored Patient Re-evaluated:Patient Re-evaluated prior to inductionOxygen Delivery Method: Circle system utilized Preoxygenation: Pre-oxygenation with 100% oxygen Intubation Type: IV induction LMA: LMA inserted LMA Size: 4.0 Number of attempts: 1 Placement Confirmation: positive ETCO2 and breath sounds checked- equal and bilateral Tube secured with: Tape

## 2016-11-28 NOTE — Addendum Note (Signed)
Addendum  created 11/28/16 1614 by Ronelle Nigh, MD   Sign clinical note

## 2016-11-28 NOTE — Anesthesia Procedure Notes (Signed)
Anesthesia Regional Block:  Popliteal block  Pre-Anesthetic Checklist: ,, timeout performed, Correct Patient, Correct Site, Correct Laterality, Correct Procedure, Correct Position, site marked, Risks and benefits discussed,  Surgical consent,  Pre-op evaluation,  At surgeon's request and post-op pain management  Laterality: Right  Prep: chloraprep       Needles:  Injection technique: Single-shot  Needle Type: Echogenic Needle     Needle Length: 9cm 9 cm Needle Gauge: 21 and 21 G    Additional Needles:  Procedures: ultrasound guided (picture in chart) Popliteal block Narrative:  Start time: 11/28/2016 11:48 AM End time: 11/28/2016 11:58 AM Injection made incrementally with aspirations every 5 mL.  Performed by: Personally  Anesthesiologist: Ronelle Nigh  Additional Notes: Functioning IV was confirmed and monitors applied. Ultrasound guidance: relevant anatomy identified, needle position confirmed, local anesthetic spread visualized around nerve(s)., vascular puncture avoided.  Image printed for medical record.  Negative aspiration and no paresthesias; incremental administration of local anesthetic. The patient tolerated the procedure well. Vitals signes recorded in RN notes. 25 ml to popliteal, and 10 ml to adductor canal.

## 2016-11-28 NOTE — Transfer of Care (Signed)
Immediate Anesthesia Transfer of Care Note  Patient: Allison Shelton  Procedure(s) Performed: Procedure(s) with comments: FLEXOR TENDON REPAIR--RIGHT GENERAL W/ POPLITEAL (Right) - GENERAL W/POPLITEAL EXCISION--SOFT TISSUE MASS--RIGHT (Right)  Patient Location: PACU  Anesthesia Type: General LMA, Regional  Level of Consciousness: awake, alert  and patient cooperative  Airway and Oxygen Therapy: Patient Spontanous Breathing and Patient connected to supplemental oxygen  Post-op Assessment: Post-op Vital signs reviewed, Patient's Cardiovascular Status Stable, Respiratory Function Stable, Patent Airway and No signs of Nausea or vomiting  Post-op Vital Signs: Reviewed and stable  Complications: No apparent anesthesia complications

## 2016-11-28 NOTE — Op Note (Signed)
Operative note   Surgeon:Dalayah Deahl Lawyer: None    Preop diagnosis: 1. Posterior lateral ankle issue mass/villonodular synovitis 2. Peroneal brevis tendon tear. 3. Peroneal longus tenosynovitis    Postop diagnosis: Same    Procedure:1. Excision of large posterior lateral soft tissue mass 2. Repair of peroneal brevis tendon tear 3. Synovectomy and Tina lysis peroneal longus tenosynovitis    EBL: 10 ML's    Anesthesia:regional and general    Hemostasis: midcalf tourniquet inflated to 200 mmHg for proximally 70 minutes    Specimen: soft tissue mass posterior lateral right ankle/pigmented villonodular synovitis    Complications: none    Operative indications:Allison Shelton is an 66 y.o. that presents today for surgical intervention.  The risks/benefits/alternatives/complications have been discussed and consent has been given.    Procedure:  Patient was brought into the OR and placed on the operating table in thesupine position. After anesthesia was obtained theright lower extremity was prepped and draped in usual sterile fashion.  Attention was directed to the posterior lateral aspect of the right ankle. The site was infiltrated with 2% lidocaine with epinephrine. After inflation of the tourniquet a longitudinal incision was made from just proximal to the fifth metatarsal base to the posterior aspect of the ankle. Sharp and blunt dissection carried down to the tendon sheath and extensor retinaculum. Extensor retinaculum was incised. There was noted be a large soft tissue mass corporative within the peroneal sheath. The sheath was then incised and the soft tissue mass was noted. This was noted to be a fairly extensive soft tissue mass that coursed from the peroneal tendon groove of the fibula all the way to the posterior aspect of the ankle. Dissection was carried to the posterior aspect of the ankle. This was extensive dissection. I was able to remove what appears to be the total soft  tissue mass. This was sent for pathological examination. The wound was flushed with copious amounts or irrigation. The peroneal tendon was noted to have a tear at the peroneal groove. The torn piece was incised and the tendon was tubularized with a 4-0 Vicryl. The peroneal longus tendon had synovitis with no obvious tear. The Topaz wand was used to infiltrate along the peroneal brevis and longus tendon repair. The extensor retinaculum had been involved in the soft tissue mass but I was able to reanastomose this to the posterior aspect of the fibula with a 2-0 Ethibond suture in a pants over vest fashion. The peroneal tendon sheath was reanastomosed with a 4-0 Vicryl. The subcutaneous tissue closed with a 3-0 Vicryl and the skin closed with a 3-0 nylon. The area was then infiltrated with 0.25% Marcaine. A large bulky sterile dressing was applied and the patient was placed in an equalizer walker boot with the foot at 90.    Patient tolerated the procedure and anesthesia well.  Was transported from the OR to the PACU with all vital signs stable and vascular status intact. To be discharged per routine protocol.  Will follow up in approximately 1 week in the outpatient clinic.

## 2016-11-28 NOTE — Discharge Instructions (Signed)
[place] [place]   1. Take your medication as prescribed.  Pain medication should be taken only as needed.  2. Keep the dressing clean, dry and intact.  3. Keep your foot elevated above the heart level for the first 48 hours.  4. Walking to the bathroom and brief periods of walking are acceptable, unless we have instructed you to be non-weight bearing.  5. Always wear your post-op shoe when walking.  Always use your crutches if you are to be non-weight bearing.  6. Do not take a shower. Baths are permissible as long as the foot is kept out of the water.   7. Every hour you are awake:  - Bend your knee 15 times.  8. Call [facility] [phone]) if any of the following problems occur: - You develop a temperature or fever. - The bandage becomes saturated with blood. - Medication does not stop your pain. - Injury of the foot occurs. - Any symptoms of infection including redness, odor, or red streaks running from wound.   General Anesthesia, Adult, Care After These instructions provide you with information about caring for yourself after your procedure. Your health care provider may also give you more specific instructions. Your treatment has been planned according to current medical practices, but problems sometimes occur. Call your health care provider if you have any problems or questions after your procedure. What can I expect after the procedure? After the procedure, it is common to have:  Vomiting.  A sore throat.  Mental slowness. It is common to feel:  Nauseous.  Cold or shivery.  Sleepy.  Tired.  Sore or achy, even in parts of your body where you did not have surgery. Follow these instructions at home: For at least 24 hours after the procedure:  Do not:  Participate in activities where you could fall or become  injured.  Drive.  Use heavy machinery.  Drink alcohol.  Take sleeping pills or medicines that cause drowsiness.  Make important decisions or sign legal documents.  Take care of children on your own.  Rest. Eating and drinking  If you vomit, drink water, juice, or soup when you can drink without vomiting.  Drink enough fluid to keep your urine clear or pale yellow.  Make sure you have little or no nausea before eating solid foods.  Follow the diet recommended by your health care provider. General instructions  Have a responsible adult stay with you until you are awake and alert.  Return to your normal activities as told by your health care provider. Ask your health care provider what activities are safe for you.  Take over-the-counter and prescription medicines only as told by your health care provider.  If you smoke, do not smoke without supervision.  Keep all follow-up visits as told by your health care provider. This is important. Contact a health care provider if:  You continue to have nausea or vomiting at home, and medicines are not helpful.  You cannot drink fluids or start eating again.  You cannot urinate after 8-12 hours.  You develop a skin rash.  You have fever.  You have increasing redness at the site of your procedure. Get help right away if:  You have difficulty breathing.  You have chest pain.  You have unexpected bleeding.  You feel that you are having a life-threatening or urgent problem. This information is not intended  to replace advice given to you by your health care provider. Make sure you discuss any questions you have with your health care provider. Document Released: 01/14/2001 Document Revised: 03/12/2016 Document Reviewed: 09/22/2015 Elsevier Interactive Patient Education  2017 [employer].

## 2016-11-28 NOTE — H&P (Signed)
  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  The various methods of treatment have been discussed with the patient.  No guarantees were given.  After consideration of risks, benefits and other options for treatment, the patient has consented to surgery.  I have reviewed the patients' chart and labs.    Patient Vitals for the past 24 hrs:  BP Temp Temp src Pulse Resp SpO2 Height Weight  11/28/16 1200 128/62 - - 63 16 99 % - -  11/28/16 1155 (!) 119/59 - - 62 (!) 9 98 % - -  11/28/16 1150 130/68 - - 71 20 100 % - -  11/28/16 1119 121/73 98.6 F (37 C) Tympanic 66 16 96 % 4\' 11"  (1.499 m) 75.3 kg (166 lb)    A history and physical examination was performed in my office.  The patient was reexamined.  There have been no changes to this history and physical examination.  Samara Deist A

## 2016-11-29 ENCOUNTER — Encounter: Payer: Self-pay | Admitting: Podiatry

## 2016-12-05 LAB — SURGICAL PATHOLOGY

## 2017-03-06 DIAGNOSIS — E782 Mixed hyperlipidemia: Secondary | ICD-10-CM | POA: Diagnosis not present

## 2017-03-06 DIAGNOSIS — Z0000 Encounter for general adult medical examination without abnormal findings: Secondary | ICD-10-CM | POA: Diagnosis not present

## 2017-03-13 DIAGNOSIS — Z79899 Other long term (current) drug therapy: Secondary | ICD-10-CM | POA: Diagnosis not present

## 2017-03-13 DIAGNOSIS — Z0000 Encounter for general adult medical examination without abnormal findings: Secondary | ICD-10-CM | POA: Diagnosis not present

## 2017-03-13 DIAGNOSIS — Z1231 Encounter for screening mammogram for malignant neoplasm of breast: Secondary | ICD-10-CM | POA: Diagnosis not present

## 2017-03-13 DIAGNOSIS — D369 Benign neoplasm, unspecified site: Secondary | ICD-10-CM | POA: Diagnosis not present

## 2017-03-13 DIAGNOSIS — E782 Mixed hyperlipidemia: Secondary | ICD-10-CM | POA: Diagnosis not present

## 2017-03-25 ENCOUNTER — Other Ambulatory Visit: Payer: Self-pay | Admitting: Internal Medicine

## 2017-03-25 DIAGNOSIS — Z1231 Encounter for screening mammogram for malignant neoplasm of breast: Secondary | ICD-10-CM

## 2017-04-08 DIAGNOSIS — L03213 Periorbital cellulitis: Secondary | ICD-10-CM | POA: Diagnosis not present

## 2017-04-12 ENCOUNTER — Ambulatory Visit
Admission: RE | Admit: 2017-04-12 | Discharge: 2017-04-12 | Disposition: A | Discharge status: Home / Self Care | Payer: PPO | Source: Ambulatory Visit | Attending: Internal Medicine | Admitting: Internal Medicine

## 2017-04-12 DIAGNOSIS — Z1231 Encounter for screening mammogram for malignant neoplasm of breast: Secondary | ICD-10-CM | POA: Diagnosis not present

## 2017-05-24 DIAGNOSIS — Z86010 Personal history of colonic polyps: Secondary | ICD-10-CM | POA: Diagnosis not present

## 2017-05-30 DIAGNOSIS — H0011 Chalazion right upper eyelid: Secondary | ICD-10-CM | POA: Diagnosis not present

## 2017-07-01 DIAGNOSIS — H11221 Conjunctival granuloma, right eye: Secondary | ICD-10-CM | POA: Diagnosis not present

## 2017-08-07 DIAGNOSIS — Z23 Encounter for immunization: Secondary | ICD-10-CM | POA: Diagnosis not present

## 2017-08-12 DIAGNOSIS — L814 Other melanin hyperpigmentation: Secondary | ICD-10-CM | POA: Diagnosis not present

## 2017-08-12 DIAGNOSIS — Z1283 Encounter for screening for malignant neoplasm of skin: Secondary | ICD-10-CM | POA: Diagnosis not present

## 2017-08-12 DIAGNOSIS — L309 Dermatitis, unspecified: Secondary | ICD-10-CM | POA: Diagnosis not present

## 2017-08-12 DIAGNOSIS — L209 Atopic dermatitis, unspecified: Secondary | ICD-10-CM | POA: Diagnosis not present

## 2017-08-12 DIAGNOSIS — L570 Actinic keratosis: Secondary | ICD-10-CM | POA: Diagnosis not present

## 2017-08-12 DIAGNOSIS — L578 Other skin changes due to chronic exposure to nonionizing radiation: Secondary | ICD-10-CM | POA: Diagnosis not present

## 2017-08-12 DIAGNOSIS — D229 Melanocytic nevi, unspecified: Secondary | ICD-10-CM | POA: Diagnosis not present

## 2017-08-12 DIAGNOSIS — L821 Other seborrheic keratosis: Secondary | ICD-10-CM | POA: Diagnosis not present

## 2017-08-12 DIAGNOSIS — I8393 Asymptomatic varicose veins of bilateral lower extremities: Secondary | ICD-10-CM | POA: Diagnosis not present

## 2017-08-12 DIAGNOSIS — L820 Inflamed seborrheic keratosis: Secondary | ICD-10-CM | POA: Diagnosis not present

## 2017-08-12 DIAGNOSIS — D1801 Hemangioma of skin and subcutaneous tissue: Secondary | ICD-10-CM | POA: Diagnosis not present

## 2017-08-12 DIAGNOSIS — Z85828 Personal history of other malignant neoplasm of skin: Secondary | ICD-10-CM | POA: Diagnosis not present

## 2017-11-07 ENCOUNTER — Encounter: Payer: Self-pay | Admitting: *Deleted

## 2017-11-08 ENCOUNTER — Encounter: Payer: Self-pay | Admitting: Certified Registered Nurse Anesthetist

## 2017-11-08 ENCOUNTER — Ambulatory Visit: Payer: PPO | Admitting: Certified Registered Nurse Anesthetist

## 2017-11-08 ENCOUNTER — Ambulatory Visit
Admission: RE | Admit: 2017-11-08 | Discharge: 2017-11-08 | Disposition: A | Discharge status: Home / Self Care | Payer: PPO | Source: Ambulatory Visit | Attending: Gastroenterology | Admitting: Gastroenterology

## 2017-11-08 ENCOUNTER — Encounter
Admission: RE | Disposition: A | Discharge status: Home / Self Care | Payer: Self-pay | Source: Ambulatory Visit | Attending: Gastroenterology

## 2017-11-08 DIAGNOSIS — I10 Essential (primary) hypertension: Secondary | ICD-10-CM | POA: Insufficient documentation

## 2017-11-08 DIAGNOSIS — Z885 Allergy status to narcotic agent status: Secondary | ICD-10-CM | POA: Diagnosis not present

## 2017-11-08 DIAGNOSIS — Z86010 Personal history of colonic polyps: Secondary | ICD-10-CM | POA: Insufficient documentation

## 2017-11-08 DIAGNOSIS — D175 Benign lipomatous neoplasm of intra-abdominal organs: Secondary | ICD-10-CM | POA: Diagnosis not present

## 2017-11-08 DIAGNOSIS — K5730 Diverticulosis of large intestine without perforation or abscess without bleeding: Secondary | ICD-10-CM | POA: Insufficient documentation

## 2017-11-08 DIAGNOSIS — Z79899 Other long term (current) drug therapy: Secondary | ICD-10-CM | POA: Insufficient documentation

## 2017-11-08 DIAGNOSIS — J45909 Unspecified asthma, uncomplicated: Secondary | ICD-10-CM | POA: Diagnosis not present

## 2017-11-08 DIAGNOSIS — Z881 Allergy status to other antibiotic agents status: Secondary | ICD-10-CM | POA: Insufficient documentation

## 2017-11-08 DIAGNOSIS — K5790 Diverticulosis of intestine, part unspecified, without perforation or abscess without bleeding: Secondary | ICD-10-CM | POA: Diagnosis not present

## 2017-11-08 DIAGNOSIS — Z880 Allergy status to penicillin: Secondary | ICD-10-CM | POA: Insufficient documentation

## 2017-11-08 DIAGNOSIS — Z882 Allergy status to sulfonamides status: Secondary | ICD-10-CM | POA: Insufficient documentation

## 2017-11-08 DIAGNOSIS — Z1211 Encounter for screening for malignant neoplasm of colon: Secondary | ICD-10-CM | POA: Insufficient documentation

## 2017-11-08 HISTORY — DX: Unspecified asthma, uncomplicated: J45.909

## 2017-11-08 HISTORY — PX: COLONOSCOPY WITH PROPOFOL: SHX5780

## 2017-11-08 SURGERY — COLONOSCOPY WITH PROPOFOL
Anesthesia: General

## 2017-11-08 MED ORDER — SODIUM CHLORIDE 0.9 % IV SOLN: INTRAVENOUS | Status: DC

## 2017-11-08 MED ORDER — PROPOFOL 500 MG/50ML IV EMUL
INTRAVENOUS | Status: AC
  Filled 2017-11-08: qty 50

## 2017-11-08 MED ORDER — PHENYLEPHRINE HCL 10 MG/ML IJ SOLN
INTRAMUSCULAR | Status: DC | PRN
  Administered 2017-11-08 (×2): 100 ug via INTRAVENOUS

## 2017-11-08 MED ORDER — PROPOFOL 10 MG/ML IV BOLUS
INTRAVENOUS | Status: DC | PRN
  Administered 2017-11-08: 90 mg via INTRAVENOUS

## 2017-11-08 MED ORDER — LIDOCAINE HCL (PF) 2 % IJ SOLN
INTRAMUSCULAR | Status: AC
  Filled 2017-11-08: qty 10

## 2017-11-08 MED ORDER — SODIUM CHLORIDE 0.9 % IV SOLN
INTRAVENOUS | Status: DC
  Administered 2017-11-08: 10:00:00 via INTRAVENOUS

## 2017-11-08 MED ORDER — LIDOCAINE HCL (CARDIAC) 20 MG/ML IV SOLN
INTRAVENOUS | Status: DC | PRN
  Administered 2017-11-08: 50 mg via INTRAVENOUS

## 2017-11-08 MED ORDER — PHENYLEPHRINE HCL 10 MG/ML IJ SOLN
INTRAMUSCULAR | Status: AC
  Filled 2017-11-08: qty 1

## 2017-11-08 MED ORDER — PROPOFOL 500 MG/50ML IV EMUL
INTRAVENOUS | Status: DC | PRN
  Administered 2017-11-08: 125 ug/kg/min via INTRAVENOUS

## 2017-11-08 NOTE — Anesthesia Postprocedure Evaluation (Signed)
Anesthesia Post Note  Patient: MARGRETTA ZAMORANO  Procedure(s) Performed: COLONOSCOPY WITH PROPOFOL (N/A )  Patient location during evaluation: Endoscopy Anesthesia Type: General Level of consciousness: awake and alert Pain management: pain level controlled Vital Signs Assessment: post-procedure vital signs reviewed and stable Respiratory status: spontaneous breathing, nonlabored ventilation, respiratory function stable and patient connected to nasal cannula oxygen Cardiovascular status: blood pressure returned to baseline and stable Postop Assessment: no apparent nausea or vomiting Anesthetic complications: no     Last Vitals:  Vitals:   11/08/17 1121 11/08/17 1131  BP: 121/65 109/69  Pulse: 63 (!) 59  Resp: 17 13  Temp:    SpO2: 100% 100%    Last Pain:  Vitals:   11/08/17 1006  TempSrc: Tympanic                 Precious Haws Laurens Matheny

## 2017-11-08 NOTE — H&P (Signed)
Outpatient short stay form Pre-procedure 11/08/2017 10:23 AM Lollie Sails MD  Primary Physician: Dr. Emily Filbert  Reason for visit:  Colonoscopy  History of present illness:  Patient is a 67 year old female presenting today as above. She has personal history of adenomatous colon polyps. She tolerated her prep well. She takes no aspirin or blood thinning agents. Her last colonoscopy was 07/15/2012 with a finding of polyp at that time.    Current Facility-Administered Medications:  .  0.9 %  sodium chloride infusion, , Intravenous, Continuous, Lollie Sails, MD .  0.9 %  sodium chloride infusion, , Intravenous, Continuous, Lollie Sails, MD  Medications Prior to Admission  Medication Sig Dispense Refill Last Dose  . Cholecalciferol 2000 units TABS Take by mouth 2 (two) times daily.   11/07/2017 at Unknown time  . fluticasone (FLONASE) 50 MCG/ACT nasal spray Place into both nostrils daily.   11/07/2017 at Unknown time  . furosemide (LASIX) 20 MG tablet Take 20 mg by mouth daily.   11/07/2017 at Unknown time  . loratadine (CLARITIN) 10 MG tablet Take 10 mg by mouth daily.   11/07/2017 at Unknown time  . potassium chloride (K-DUR) 10 MEQ tablet Take 10 mEq by mouth daily.   11/07/2017 at Unknown time  . telmisartan (MICARDIS) 80 MG tablet Take 80 mg by mouth daily.   11/08/2017 at Unknown time  . HYDROcodone-acetaminophen (NORCO) 5-325 MG tablet Take 1 tablet by mouth every 6 (six) hours as needed for moderate pain. (Patient not taking: Reported on 11/20/2016) 30 tablet 0 Not Taking at Unknown time     Allergies  Allergen Reactions  . Erythromycin Other (See Comments)    Feeling of something stuck at base of throat  . Morphine And Related Other (See Comments)    BP dropped, dizziness  . Tetracyclines & Related Other (See Comments)    Feeling of something stuck at base of throat  . Penicillins Rash  . Sulfa Antibiotics Rash     Past Medical History:  Diagnosis Date  .  Asthma   . Deaf, right   . Hypertension   . Vertigo    when "head is stopped up"  . Wears contact lenses     Review of systems:      Physical Exam    Heart and lungs: Regular rate and rhythm without rub or gallop, lungs are bilaterally clear.    HEENT: Normocephalic atraumatic eyes are anicteric    Other:     Pertinant exam for procedure: Soft nontender nondistended bowel sounds positive normoactive    Planned proceedures: Colonoscopy and indicated procedures. I have discussed the risks benefits and complications of procedures to include not limited to bleeding, infection, perforation and the risk of sedation and the patient wishes to proceed.    Lollie Sails, MD Gastroenterology 11/08/2017  10:23 AM

## 2017-11-08 NOTE — Transfer of Care (Signed)
Immediate Anesthesia Transfer of Care Note  Patient: Allison Shelton  Procedure(s) Performed: COLONOSCOPY WITH PROPOFOL (N/A )  Patient Location: PACU and Endoscopy Unit  Anesthesia Type:General  Level of Consciousness: drowsy  Airway & Oxygen Therapy: Patient Spontanous Breathing and Patient connected to nasal cannula oxygen  Post-op Assessment: Report given to RN and Post -op Vital signs reviewed and stable  Post vital signs: Reviewed and stable  Last Vitals:  Vitals:   11/08/17 1006 11/08/17 1057  BP: 138/68 (!) (P) 79/40  Pulse: 72   Resp: 14   Temp: (!) 35.6 C (!) (P) 36.3 C  SpO2: 100%     Last Pain:  Vitals:   11/08/17 1006  TempSrc: Tympanic         Complications: No apparent anesthesia complications

## 2017-11-08 NOTE — Op Note (Signed)
[facility] Gastroenterology Patient Name: Allison Shelton Procedure Date: 11/08/2017 10:00 AM MRN: [id number] Account #: [id number] Date of Birth: 1951/07/15 Admit Type: Outpatient Age: 67 Room: [facility] ENDO ROOM 1 Gender: Female Note Status: Finalized Procedure:            Colonoscopy Indications:          Personal history of colonic polyps Providers:            Lollie Sails, MD Referring MD:         Rusty Aus, MD (Referring MD) Medicines:            Monitored Anesthesia Care Complications:        No immediate complications. Procedure:            Pre-Anesthesia Assessment:                       - ASA Grade Assessment: III - A patient with severe                        systemic disease.                       After obtaining informed consent, the colonoscope was                        passed under direct vision. Throughout the procedure,                        the patient's blood pressure, pulse, and oxygen                        saturations were monitored continuously. The                        Colonoscope was introduced through the anus and                        advanced to the the cecum, identified by appendiceal                        orifice and ileocecal valve. The colonoscopy was                        performed with moderate difficulty. Successful                        completion of the procedure was aided by changing the                        patient to a supine position. The patient tolerated the                        procedure well. The quality of the bowel preparation                        was good. Findings:      There was a medium-sized lipoma, at the hepatic flexure,       non-obstructing, positive pillow sign.      Multiple small-mouthed diverticula were found in the sigmoid colon and       descending colon.  The retroflexed view of the distal rectum and anal verge was normal and       showed no anal or rectal abnormalities.  The digital rectal exam was normal. 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                       - Diverticulosis in the sigmoid colon and in the                        descending colon.                       - The distal rectum and anal verge are normal on                        retroflexion view.                       - No specimens collected. Recommendation:       - Discharge patient to home.                       - Repeat colonoscopy in 5 years for surveillance. 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 All rights reserved. The codes documented in this report are preliminary and upon coder review may  be revised to meet current compliance requirements. Lollie Sails, MD 11/08/2017 10:57:40 AM This report has been signed electronically. Number of Addenda: 0 Note Initiated On: 11/08/2017 10:00 AM Scope Withdrawal Time: 0 hours 7 minutes 49 seconds  Total Procedure Duration: 0 hours 24 minutes 2 seconds       [facility]

## 2017-11-08 NOTE — Anesthesia Preprocedure Evaluation (Signed)
Anesthesia Evaluation  Patient identified by MRN, date of birth, ID band Patient awake    Reviewed: Allergy & Precautions, H&P , NPO status , Patient's Chart, lab work & pertinent test results  History of Anesthesia Complications Negative for: history of anesthetic complications  Airway Mallampati: III  TM Distance: <3 FB Neck ROM: limited    Dental  (+) Chipped   Pulmonary asthma ,           Cardiovascular Exercise Tolerance: Good hypertension, (-) angina(-) Past MI and (-) DOE      Neuro/Psych negative neurological ROS  negative psych ROS   GI/Hepatic negative GI ROS, Neg liver ROS, neg GERD  ,  Endo/Other  negative endocrine ROS  Renal/GU negative Renal ROS  negative genitourinary   Musculoskeletal   Abdominal   Peds  Hematology negative hematology ROS (+)   Anesthesia Other Findings Past Medical History: No date: Asthma No date: Deaf, right No date: Hypertension No date: Vertigo     Comment:  when "head is stopped up" No date: Wears contact lenses  Past Surgical History: No date: CESAREAN SECTION     Comment:  x2 No date: CESAREAN SECTION No date: COLONOSCOPY 09/05/2016: MASS EXCISION; Right     Comment:  Procedure: EXCISION MASS BIOPSY DEEP RIGHT ANKLE;                Surgeon: Samara Deist, DPM;  Location: [place];  Service: Podiatry;  Laterality: Right;  IV WITH               LOCAL 11/28/2016: MASS EXCISION; Right     Comment:  Procedure: EXCISION--SOFT TISSUE MASS--RIGHT;  Surgeon:               Samara Deist, DPM;  Location: [place];                Service: Podiatry;  Laterality: Right; 11/28/2016: TENOTOMY / FLEXOR TENDON TRANSFER; Right     Comment:  Procedure: FLEXOR TENDON REPAIR--RIGHT GENERAL W/               POPLITEAL;  Surgeon: Samara Deist, DPM;  Location:               [place];  Service: Podiatry;  Laterality:               Right;   GENERAL W/POPLITEAL No date: TUBAL LIGATION  BMI    Body Mass Index:  35.75 kg/m      Reproductive/Obstetrics negative OB ROS                             Anesthesia Physical Anesthesia Plan  ASA: III  Anesthesia Plan: General   Post-op Pain Management:    Induction: Intravenous  PONV Risk Score and Plan: Propofol infusion  Airway Management Planned: Natural Airway and Nasal Cannula  Additional Equipment:   Intra-op Plan:   Post-operative Plan:   Informed Consent: I have reviewed the patients History and Physical, chart, labs and discussed the procedure including the risks, benefits and alternatives for the proposed anesthesia with the patient or authorized representative who has indicated his/her understanding and acceptance.   Dental Advisory Given  Plan Discussed with: Anesthesiologist, CRNA and Surgeon  Anesthesia Plan Comments: (Patient consented for risks of anesthesia including but not limited to:  - adverse reactions to medications -  risk of intubation if required - damage to teeth, lips or other oral mucosa - sore throat or hoarseness - Damage to heart, brain, lungs or loss of life  Patient voiced understanding.)        Anesthesia Quick Evaluation

## 2017-11-08 NOTE — Anesthesia Post-op Follow-up Note (Signed)
Anesthesia QCDR form completed.        

## 2017-11-09 ENCOUNTER — Encounter: Payer: Self-pay | Admitting: Gastroenterology

## 2017-11-19 DIAGNOSIS — S86311D Strain of muscle(s) and tendon(s) of peroneal muscle group at lower leg level, right leg, subsequent encounter: Secondary | ICD-10-CM | POA: Diagnosis not present

## 2017-11-28 DIAGNOSIS — B9789 Other viral agents as the cause of diseases classified elsewhere: Secondary | ICD-10-CM | POA: Diagnosis not present

## 2017-11-28 DIAGNOSIS — J028 Acute pharyngitis due to other specified organisms: Secondary | ICD-10-CM | POA: Diagnosis not present

## 2018-03-07 DIAGNOSIS — Z79899 Other long term (current) drug therapy: Secondary | ICD-10-CM | POA: Diagnosis not present

## 2018-03-07 DIAGNOSIS — E782 Mixed hyperlipidemia: Secondary | ICD-10-CM | POA: Diagnosis not present

## 2018-03-14 ENCOUNTER — Other Ambulatory Visit: Payer: Self-pay | Admitting: Internal Medicine

## 2018-03-14 DIAGNOSIS — Z79899 Other long term (current) drug therapy: Secondary | ICD-10-CM | POA: Diagnosis not present

## 2018-03-14 DIAGNOSIS — E538 Deficiency of other specified B group vitamins: Secondary | ICD-10-CM | POA: Diagnosis not present

## 2018-03-14 DIAGNOSIS — Z1231 Encounter for screening mammogram for malignant neoplasm of breast: Secondary | ICD-10-CM

## 2018-03-14 DIAGNOSIS — E782 Mixed hyperlipidemia: Secondary | ICD-10-CM | POA: Diagnosis not present

## 2018-03-14 DIAGNOSIS — Z0000 Encounter for general adult medical examination without abnormal findings: Secondary | ICD-10-CM | POA: Diagnosis not present

## 2018-03-14 DIAGNOSIS — D369 Benign neoplasm, unspecified site: Secondary | ICD-10-CM | POA: Diagnosis not present

## 2018-04-14 ENCOUNTER — Ambulatory Visit
Admission: RE | Admit: 2018-04-14 | Discharge: 2018-04-14 | Disposition: A | Discharge status: Home / Self Care | Payer: PPO | Source: Ambulatory Visit | Attending: Internal Medicine | Admitting: Internal Medicine

## 2018-04-14 DIAGNOSIS — Z1231 Encounter for screening mammogram for malignant neoplasm of breast: Secondary | ICD-10-CM | POA: Diagnosis not present

## 2018-05-02 DIAGNOSIS — H0012 Chalazion right lower eyelid: Secondary | ICD-10-CM | POA: Diagnosis not present

## 2018-08-13 DIAGNOSIS — Z1283 Encounter for screening for malignant neoplasm of skin: Secondary | ICD-10-CM | POA: Diagnosis not present

## 2018-08-13 DIAGNOSIS — L812 Freckles: Secondary | ICD-10-CM | POA: Diagnosis not present

## 2018-08-13 DIAGNOSIS — D225 Melanocytic nevi of trunk: Secondary | ICD-10-CM | POA: Diagnosis not present

## 2018-08-13 DIAGNOSIS — L570 Actinic keratosis: Secondary | ICD-10-CM | POA: Diagnosis not present

## 2018-08-13 DIAGNOSIS — D1800 Hemangioma unspecified site: Secondary | ICD-10-CM | POA: Diagnosis not present

## 2018-08-13 DIAGNOSIS — Z85828 Personal history of other malignant neoplasm of skin: Secondary | ICD-10-CM | POA: Diagnosis not present

## 2018-08-13 DIAGNOSIS — L821 Other seborrheic keratosis: Secondary | ICD-10-CM | POA: Diagnosis not present

## 2018-08-13 DIAGNOSIS — L578 Other skin changes due to chronic exposure to nonionizing radiation: Secondary | ICD-10-CM | POA: Diagnosis not present

## 2018-09-08 IMAGING — MG MM DIGITAL SCREENING BILAT W/ TOMO W/ CAD
8 of 12 series · 8 of 28 positions shown · non-contrast
Comparison: Previous exam(s).

CLINICAL DATA: Screening.

EXAM:
2D DIGITAL SCREENING BILATERAL MAMMOGRAM WITH CAD AND ADJUNCT TOMO

[L CC]
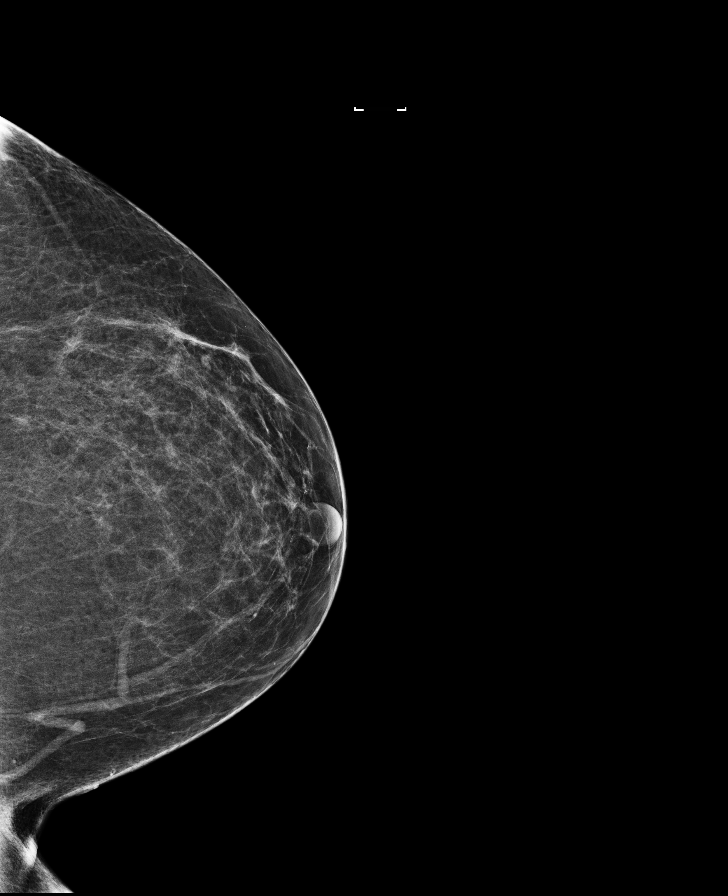

[R MLO]
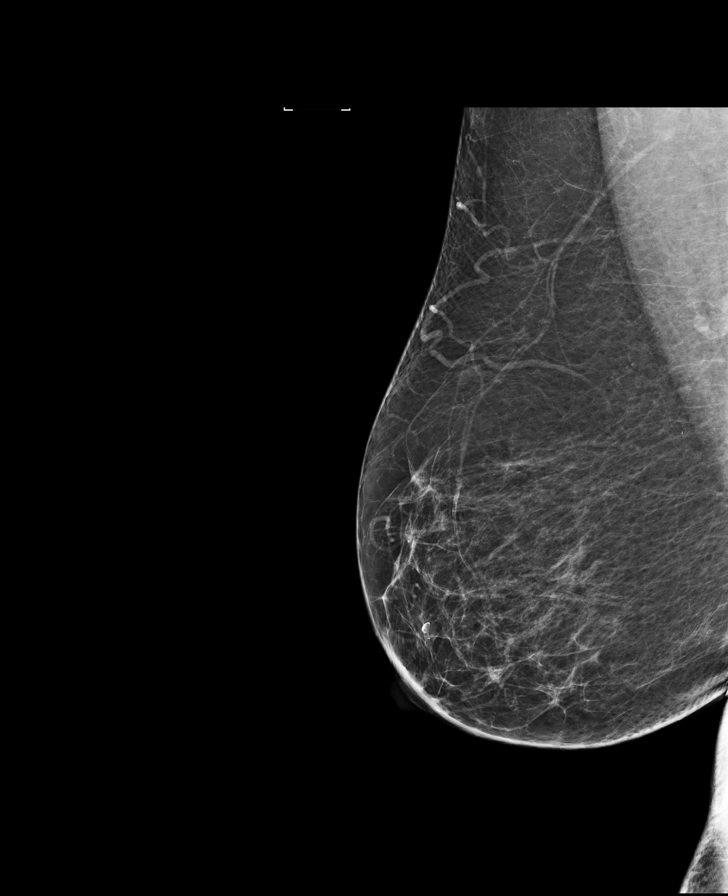

[R MLO synth-2D]
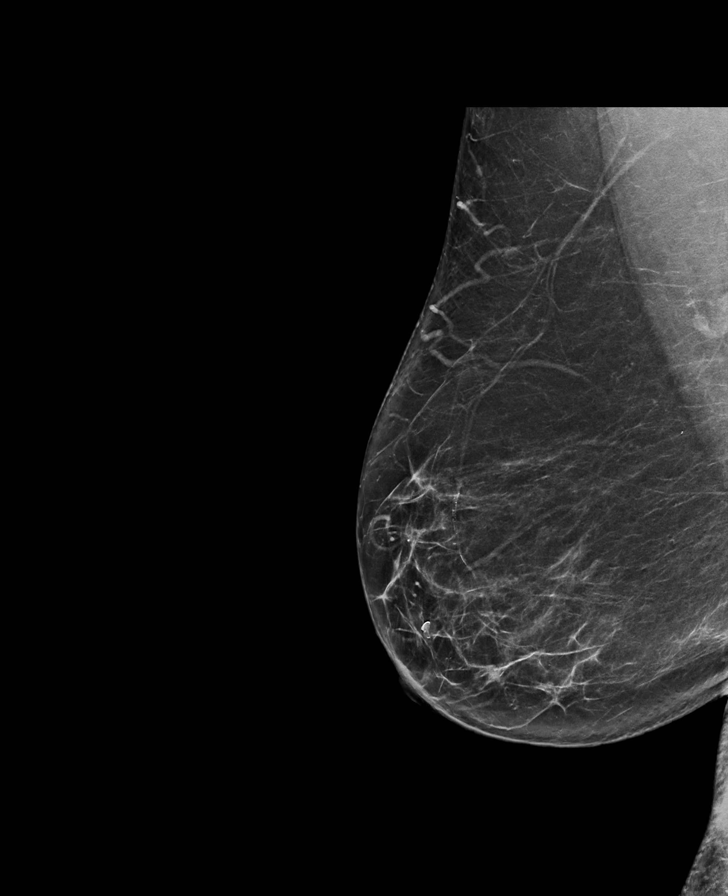

[L MLO synth-2D]
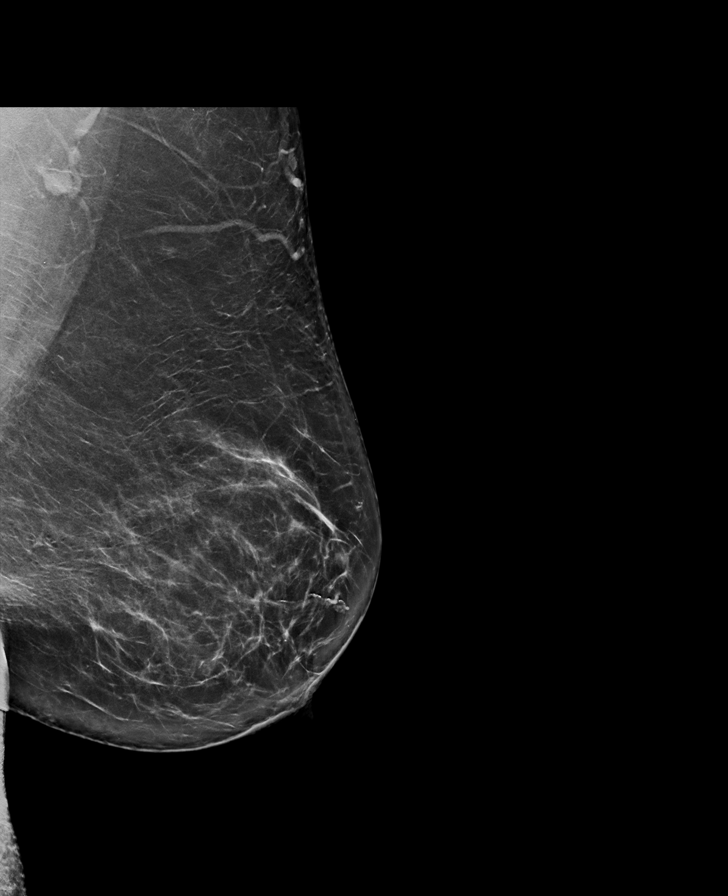

[L MLO]
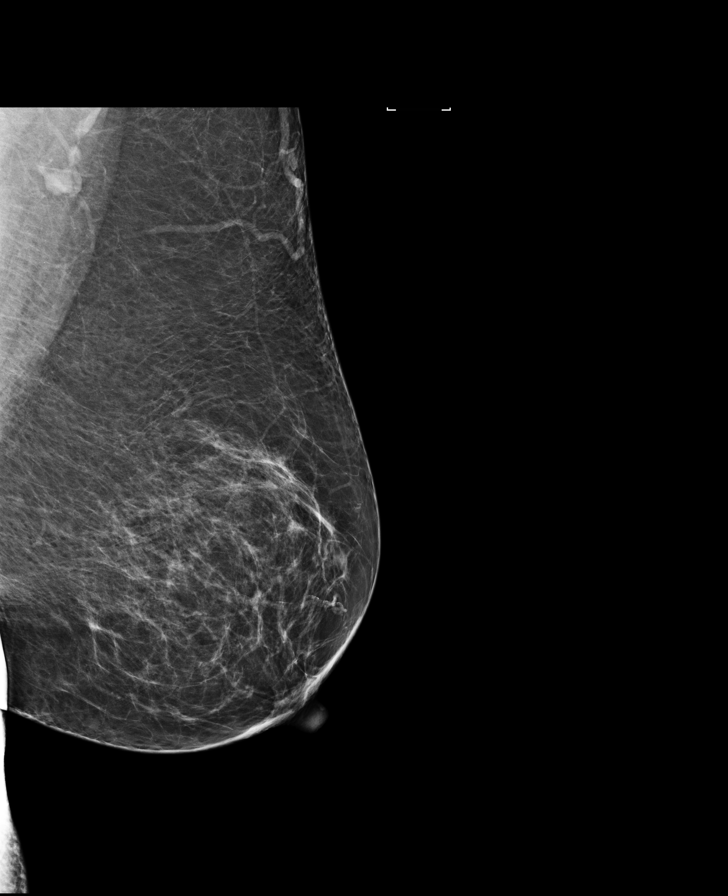

[L CC synth-2D]
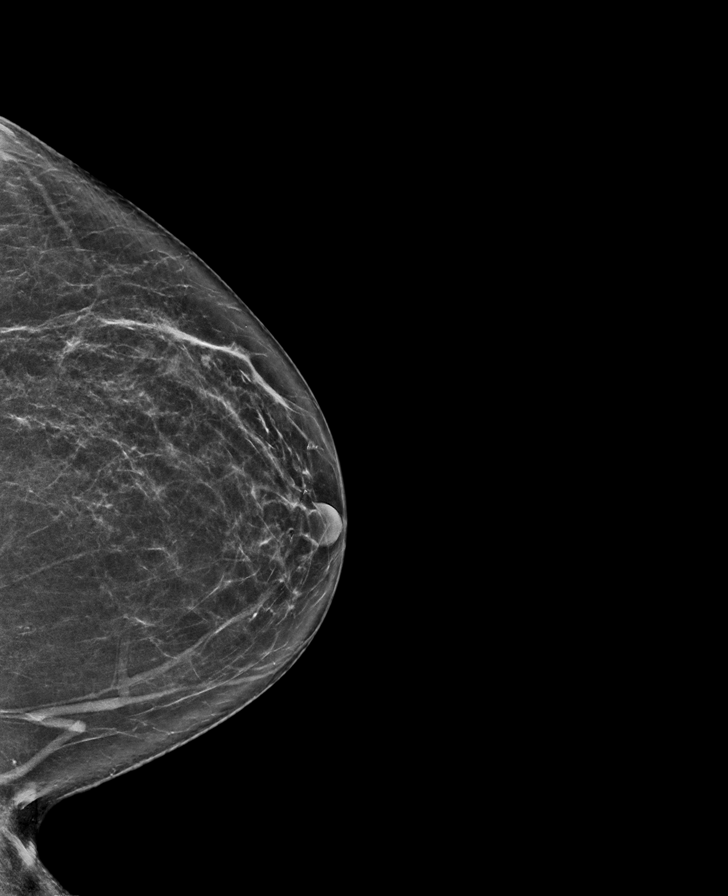

[R CC]
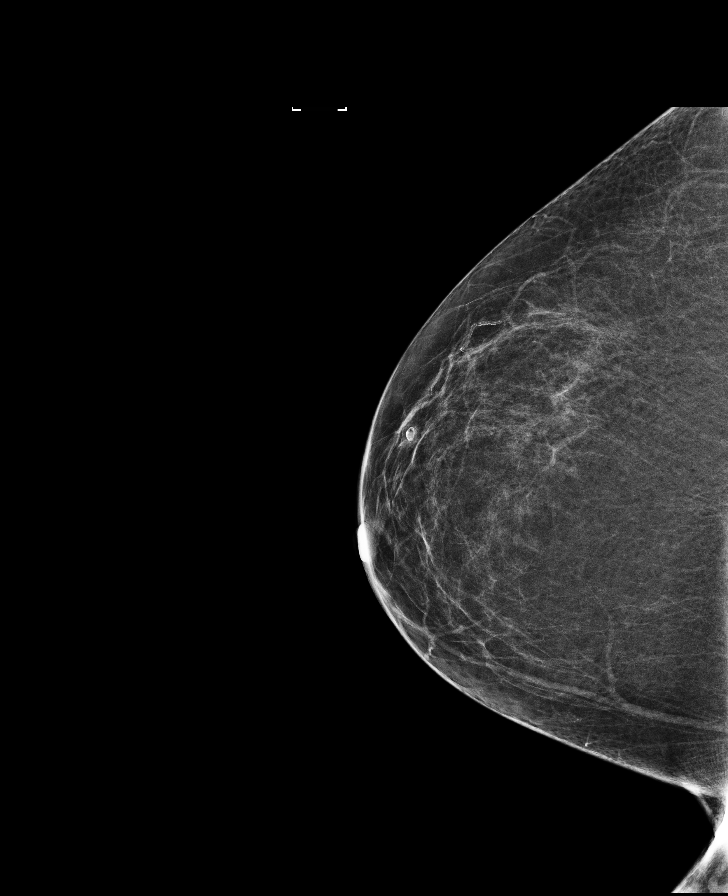

[R CC synth-2D]
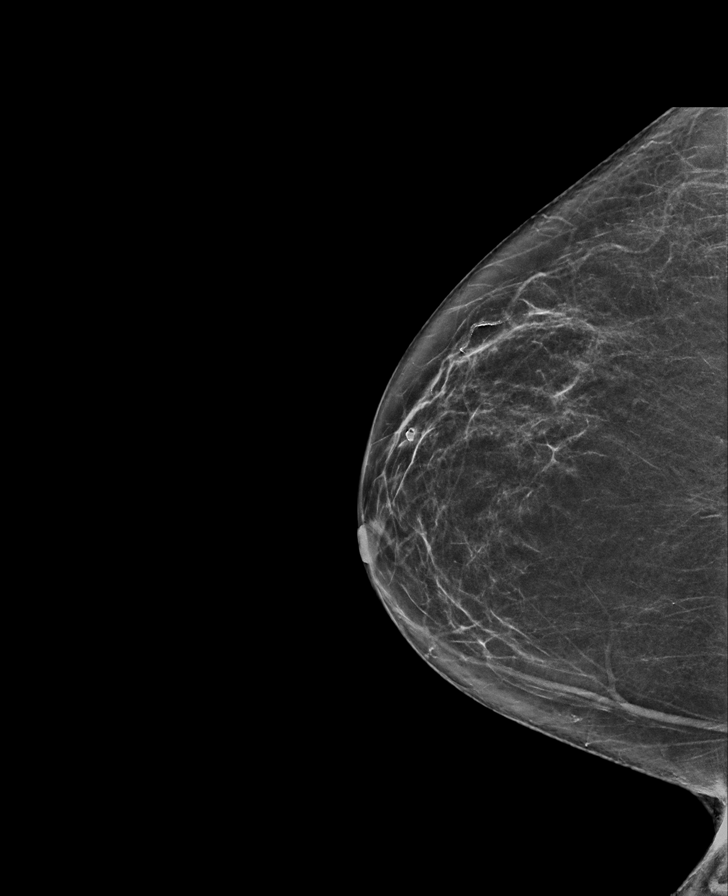

[8 of 28 positions shown; findings below may reference images not displayed]

ACR Breast Density Category b: There are scattered areas of
fibroglandular density.
FINDINGS: There are no findings suspicious for malignancy. Images were
processed with CAD.
IMPRESSION: No mammographic evidence of malignancy. A result letter of this
screening mammogram will be mailed directly to the patient.

RECOMMENDATION:
Screening mammogram in one year. (Code:97-6-RS4)

BI-RADS CATEGORY  1: Negative.

## 2019-03-10 DIAGNOSIS — E782 Mixed hyperlipidemia: Secondary | ICD-10-CM | POA: Diagnosis not present

## 2019-03-10 DIAGNOSIS — E538 Deficiency of other specified B group vitamins: Secondary | ICD-10-CM | POA: Diagnosis not present

## 2019-03-10 DIAGNOSIS — Z79899 Other long term (current) drug therapy: Secondary | ICD-10-CM | POA: Diagnosis not present

## 2019-03-17 DIAGNOSIS — E782 Mixed hyperlipidemia: Secondary | ICD-10-CM | POA: Diagnosis not present

## 2019-03-17 DIAGNOSIS — E538 Deficiency of other specified B group vitamins: Secondary | ICD-10-CM | POA: Diagnosis not present

## 2019-03-17 DIAGNOSIS — Z0000 Encounter for general adult medical examination without abnormal findings: Secondary | ICD-10-CM | POA: Diagnosis not present

## 2019-04-10 DIAGNOSIS — H43812 Vitreous degeneration, left eye: Secondary | ICD-10-CM | POA: Diagnosis not present

## 2019-05-14 DIAGNOSIS — H43812 Vitreous degeneration, left eye: Secondary | ICD-10-CM | POA: Diagnosis not present

## 2019-05-18 DIAGNOSIS — E782 Mixed hyperlipidemia: Secondary | ICD-10-CM | POA: Diagnosis not present

## 2019-06-03 ENCOUNTER — Other Ambulatory Visit: Payer: Self-pay | Admitting: Internal Medicine

## 2019-06-03 DIAGNOSIS — Z1231 Encounter for screening mammogram for malignant neoplasm of breast: Secondary | ICD-10-CM

## 2019-07-08 ENCOUNTER — Ambulatory Visit
Admission: RE | Admit: 2019-07-08 | Discharge: 2019-07-08 | Disposition: A | Discharge status: Home / Self Care | Payer: PPO | Source: Ambulatory Visit | Attending: Internal Medicine | Admitting: Internal Medicine

## 2019-07-08 DIAGNOSIS — Z1231 Encounter for screening mammogram for malignant neoplasm of breast: Secondary | ICD-10-CM | POA: Diagnosis not present

## 2019-12-05 ENCOUNTER — Ambulatory Visit: Payer: PPO | Attending: Internal Medicine

## 2019-12-05 DIAGNOSIS — Z23 Encounter for immunization: Secondary | ICD-10-CM

## 2019-12-05 NOTE — Progress Notes (Signed)
   Covid-19 Vaccination Clinic  Name:  Allison Shelton    MRN: [id number] DOB: 1951/04/15  12/05/2019  [name] was observed post Covid-19 immunization for 30 minutes based on pre-vaccination screening without incidence. She was provided with Vaccine Information Sheet and instruction to access the V-Safe system.   [name] was instructed to call 911 with any severe reactions post vaccine: Marland Kitchen Difficulty breathing  . Swelling of your face and throat  . A fast heartbeat  . A bad rash all over your body  . Dizziness and weakness    Immunizations Administered    Name Date Dose VIS Date Route   Pfizer COVID-19 Vaccine 12/05/2019  9:13 AM 0.3 mL 10/02/2019 Intramuscular   Manufacturer: [place]   Lot: [id number]   [place]: [id number]

## 2019-12-26 ENCOUNTER — Other Ambulatory Visit: Payer: Self-pay

## 2019-12-26 ENCOUNTER — Ambulatory Visit: Payer: PPO | Attending: Internal Medicine

## 2019-12-26 DIAGNOSIS — Z23 Encounter for immunization: Secondary | ICD-10-CM | POA: Insufficient documentation

## 2019-12-26 NOTE — Progress Notes (Signed)
   Covid-19 Vaccination Clinic  Name:  Allison Shelton    MRN: [id number] DOB: 03-Jun-1951  12/26/2019  Allison Shelton was observed post Covid-19 immunization for 30 minutes based on pre-vaccination screening without incident. She was provided with Vaccine Information Sheet and instruction to access the V-Safe system.   Allison Shelton was instructed to call 911 with any severe reactions post vaccine: Marland Kitchen Difficulty breathing  . Swelling of face and throat  . A fast heartbeat  . A bad rash all over body  . Dizziness and weakness   Immunizations Administered    Name Date Dose VIS Date Route   Pfizer COVID-19 Vaccine 12/26/2019  8:26 AM 0.3 mL 10/02/2019 Intramuscular   Manufacturer: [place]   Lot: [id number]   [place]: [id number]

## 2020-02-15 ENCOUNTER — Other Ambulatory Visit: Payer: Self-pay

## 2020-02-15 ENCOUNTER — Ambulatory Visit: Payer: PPO | Admitting: Dermatology

## 2020-02-15 ENCOUNTER — Encounter: Payer: Self-pay | Admitting: Dermatology

## 2020-02-15 DIAGNOSIS — L578 Other skin changes due to chronic exposure to nonionizing radiation: Secondary | ICD-10-CM | POA: Diagnosis not present

## 2020-02-15 DIAGNOSIS — Z1283 Encounter for screening for malignant neoplasm of skin: Secondary | ICD-10-CM | POA: Diagnosis not present

## 2020-02-15 DIAGNOSIS — L820 Inflamed seborrheic keratosis: Secondary | ICD-10-CM

## 2020-02-15 DIAGNOSIS — D1800 Hemangioma unspecified site: Secondary | ICD-10-CM | POA: Diagnosis not present

## 2020-02-15 DIAGNOSIS — L821 Other seborrheic keratosis: Secondary | ICD-10-CM | POA: Diagnosis not present

## 2020-02-15 DIAGNOSIS — I8393 Asymptomatic varicose veins of bilateral lower extremities: Secondary | ICD-10-CM | POA: Diagnosis not present

## 2020-02-15 DIAGNOSIS — L720 Epidermal cyst: Secondary | ICD-10-CM | POA: Diagnosis not present

## 2020-02-15 DIAGNOSIS — L570 Actinic keratosis: Secondary | ICD-10-CM

## 2020-02-15 NOTE — Progress Notes (Addendum)
Follow-Up Visit   Subjective  Allison Shelton is a 69 y.o. female who presents for the following: Annual Exam (rough lesions on the scalp that patient would like treated. ).  The following portions of the chart were reviewed this encounter and updated as appropriate:  Tobacco  Allergies  Meds  Problems  Med Hx  Surg Hx  Fam Hx     Review of Systems:  No other skin or systemic complaints except as noted in HPI or Assessment and Plan.  Objective  Well appearing patient in no apparent distress; mood and affect are within normal limits.  A full examination was performed including scalp, head, eyes, ears, nose, lips, neck, chest, axillae, abdomen, back, buttocks, bilateral upper extremities, bilateral lower extremities, hands, feet, fingers, toes, fingernails, and toenails. All findings within normal limits unless otherwise noted below.  Objective  L nose x 1: Erythematous thin papules/macules with gritty scale.   Objective  Scalp x 3, R arm x 2, back x 14 (19): Erythematous keratotic or waxy stuck-on papule or plaque.   Objective  face: Smooth white papule(s).    Assessment & Plan  AK (actinic keratosis) L nose x 1  Destruction of lesion - L nose x 1 Complexity: simple   Destruction method: cryotherapy   Informed consent: discussed and consent obtained   Timeout:  patient name, date of birth, surgical site, and procedure verified Lesion destroyed using liquid nitrogen: Yes   Region frozen until ice ball extended beyond lesion: Yes   Outcome: patient tolerated procedure well with no complications   Post-procedure details: wound care instructions given    Inflamed seborrheic keratosis (19) Scalp x 3, R arm x 2, back x 14  Destruction of lesion - Scalp x 3, R arm x 2, back x 14 Complexity: simple   Destruction method: cryotherapy   Informed consent: discussed and consent obtained   Timeout:  patient name, date of birth, surgical site, and procedure verified Lesion  destroyed using liquid nitrogen: Yes   Region frozen until ice ball extended beyond lesion: Yes   Outcome: patient tolerated procedure well with no complications   Post-procedure details: wound care instructions given    [place] face  Benign, observe.     Seborrheic Keratoses - Stuck-on, waxy, tan-brown papules and plaques  - Discussed benign etiology and prognosis. - Observe - Call for any changes  Actinic Damage - diffuse scaly erythematous macules with underlying dyspigmentation - Recommend daily broad spectrum sunscreen SPF 30+ to sun-exposed areas, reapply every 2 hours as needed.  - Call for new or changing lesions.  Lentigines - Scattered tan macules - Discussed due to sun exposure - Benign, observe - Call for any changes  Melanocytic Nevi - Tan-brown and/or pink-flesh-colored symmetric macules and papules - Benign appearing on exam today - Observation - Call clinic for new or changing moles - Recommend daily use of broad spectrum spf 30+ sunscreen to sun-exposed areas.   Hemangiomas - Red papules - Discussed benign nature - Observe - Call for any changes  Spider Veins - Dilated blue, purple or red veins at the lower extremities - Reassured - These can be treated by sclerotherapy (a procedure to inject a medicine into the veins to make them disappear) if desired, but the treatment is not covered by insurance   Return in about 1 year (around 02/14/2021).  Luther Redo, CMA, am acting as scribe for Sarina Ser, MD .  Documentation: I have reviewed the above documentation for  accuracy and completeness, and I agree with the above.  Sarina Ser, MD

## 2020-03-10 DIAGNOSIS — E538 Deficiency of other specified B group vitamins: Secondary | ICD-10-CM | POA: Diagnosis not present

## 2020-03-10 DIAGNOSIS — E782 Mixed hyperlipidemia: Secondary | ICD-10-CM | POA: Diagnosis not present

## 2020-03-17 DIAGNOSIS — R739 Hyperglycemia, unspecified: Secondary | ICD-10-CM | POA: Diagnosis not present

## 2020-03-17 DIAGNOSIS — E782 Mixed hyperlipidemia: Secondary | ICD-10-CM | POA: Diagnosis not present

## 2020-03-17 DIAGNOSIS — E538 Deficiency of other specified B group vitamins: Secondary | ICD-10-CM | POA: Diagnosis not present

## 2020-03-17 DIAGNOSIS — Z0000 Encounter for general adult medical examination without abnormal findings: Secondary | ICD-10-CM | POA: Diagnosis not present

## 2020-05-17 DIAGNOSIS — H43812 Vitreous degeneration, left eye: Secondary | ICD-10-CM | POA: Diagnosis not present

## 2020-06-02 DIAGNOSIS — M2041 Other hammer toe(s) (acquired), right foot: Secondary | ICD-10-CM | POA: Diagnosis not present

## 2020-06-02 DIAGNOSIS — M21621 Bunionette of right foot: Secondary | ICD-10-CM | POA: Diagnosis not present

## 2020-06-02 DIAGNOSIS — M2042 Other hammer toe(s) (acquired), left foot: Secondary | ICD-10-CM | POA: Diagnosis not present

## 2020-06-10 ENCOUNTER — Other Ambulatory Visit: Payer: Self-pay | Admitting: Internal Medicine

## 2020-06-10 DIAGNOSIS — Z1231 Encounter for screening mammogram for malignant neoplasm of breast: Secondary | ICD-10-CM

## 2020-07-12 ENCOUNTER — Ambulatory Visit
Admission: RE | Admit: 2020-07-12 | Discharge: 2020-07-12 | Disposition: A | Discharge status: Home / Self Care | Payer: PPO | Source: Ambulatory Visit | Attending: Internal Medicine | Admitting: Internal Medicine

## 2020-07-12 DIAGNOSIS — Z1231 Encounter for screening mammogram for malignant neoplasm of breast: Secondary | ICD-10-CM | POA: Insufficient documentation

## 2021-02-16 ENCOUNTER — Ambulatory Visit (INDEPENDENT_AMBULATORY_CARE_PROVIDER_SITE_OTHER): Payer: PPO | Admitting: Dermatology

## 2021-02-16 ENCOUNTER — Encounter: Payer: Self-pay | Admitting: Dermatology

## 2021-02-16 ENCOUNTER — Other Ambulatory Visit: Payer: Self-pay

## 2021-02-16 DIAGNOSIS — D1800 Hemangioma unspecified site: Secondary | ICD-10-CM | POA: Diagnosis not present

## 2021-02-16 DIAGNOSIS — L578 Other skin changes due to chronic exposure to nonionizing radiation: Secondary | ICD-10-CM

## 2021-02-16 DIAGNOSIS — L821 Other seborrheic keratosis: Secondary | ICD-10-CM | POA: Diagnosis not present

## 2021-02-16 DIAGNOSIS — L820 Inflamed seborrheic keratosis: Secondary | ICD-10-CM

## 2021-02-16 DIAGNOSIS — D229 Melanocytic nevi, unspecified: Secondary | ICD-10-CM

## 2021-02-16 DIAGNOSIS — Z1283 Encounter for screening for malignant neoplasm of skin: Secondary | ICD-10-CM | POA: Diagnosis not present

## 2021-02-16 DIAGNOSIS — Z872 Personal history of diseases of the skin and subcutaneous tissue: Secondary | ICD-10-CM

## 2021-02-16 DIAGNOSIS — L814 Other melanin hyperpigmentation: Secondary | ICD-10-CM | POA: Diagnosis not present

## 2021-02-16 NOTE — Progress Notes (Signed)
Follow-Up Visit   Subjective  Allison Shelton is a 70 y.o. female who presents for the following: Annual Exam (Patient here for TBSE. She has a history of Aks and ISKs. She has a few itchy spots on her back and a bothersome spot behind her right ear. No history of skin cancer.). The patient presents for Total-Body Skin Exam (TBSE) for skin cancer screening and mole check.  The following portions of the chart were reviewed this encounter and updated as appropriate:   Tobacco  Allergies  Meds  Problems  Med Hx  Surg Hx  Fam Hx     Review of Systems:  No other skin or systemic complaints except as noted in HPI or Assessment and Plan.  Objective  Well appearing patient in no apparent distress; mood and affect are within normal limits.  A full examination was performed including scalp, head, eyes, ears, nose, lips, neck, chest, axillae, abdomen, back, buttocks, bilateral upper extremities, bilateral lower extremities, hands, feet, fingers, toes, fingernails, and toenails. All findings within normal limits unless otherwise noted below.  Objective  Back x 20, R lat lower thigh x 1, L popliteal x 1, R post auricular x 1 (23): Erythematous keratotic or waxy stuck-on papule or plaque.    Assessment & Plan   Lentigines - Scattered tan macules - Due to sun exposure - Benign-appering, observe - Recommend daily broad spectrum sunscreen SPF 30+ to sun-exposed areas, reapply every 2 hours as needed. - Call for any changes  Seborrheic Keratoses - Stuck-on, waxy, tan-brown papules and/or plaques  - Benign-appearing - Discussed benign etiology and prognosis. - Observe - Call for any changes  Melanocytic Nevi - Tan-brown and/or pink-flesh-colored symmetric macules and papules - Benign appearing on exam today - Observation - Call clinic for new or changing moles - Recommend daily use of broad spectrum spf 30+ sunscreen to sun-exposed areas.   Hemangiomas - Red papules - Discussed  benign nature - Observe - Call for any changes  Actinic Damage - Chronic condition, secondary to cumulative UV/sun exposure - diffuse scaly erythematous macules with underlying dyspigmentation - Recommend daily broad spectrum sunscreen SPF 30+ to sun-exposed areas, reapply every 2 hours as needed.  - Staying in the shade or wearing long sleeves, sun glasses (UVA+UVB protection) and wide brim hats (4-inch brim around the entire circumference of the hat) are also recommended for sun protection.  - Call for new or changing lesions.  Skin cancer screening performed today.  Inflamed seborrheic keratosis (23) Back x 20, R lat lower thigh x 1, L popliteal x 1, R post auricular x 1  Prior to procedure, discussed risks of blister formation, small wound, skin dyspigmentation, or rare scar following cryotherapy.    Destruction of lesion - Back x 20, R lat lower thigh x 1, L popliteal x 1, R post auricular x 1 Complexity: simple   Destruction method: cryotherapy   Informed consent: discussed and consent obtained   Timeout:  patient name, date of birth, surgical site, and procedure verified Lesion destroyed using liquid nitrogen: Yes   Region frozen until ice ball extended beyond lesion: Yes   Outcome: patient tolerated procedure well with no complications   Post-procedure details: wound care instructions given    Skin cancer screening  Return in about 1 year (around 02/16/2022) for TBSE.  Lindi Adie, CMA, am acting as scribe for Sarina Ser, MD .  Documentation: I have reviewed the above documentation for accuracy and completeness, and I agree  with the above.  Sarina Ser, MD

## 2021-02-16 NOTE — Patient Instructions (Addendum)

## 2021-02-20 ENCOUNTER — Encounter: Payer: Self-pay | Admitting: Dermatology

## 2021-03-14 DIAGNOSIS — E538 Deficiency of other specified B group vitamins: Secondary | ICD-10-CM | POA: Diagnosis not present

## 2021-03-14 DIAGNOSIS — E782 Mixed hyperlipidemia: Secondary | ICD-10-CM | POA: Diagnosis not present

## 2021-03-14 DIAGNOSIS — R739 Hyperglycemia, unspecified: Secondary | ICD-10-CM | POA: Diagnosis not present

## 2021-03-21 DIAGNOSIS — E538 Deficiency of other specified B group vitamins: Secondary | ICD-10-CM | POA: Diagnosis not present

## 2021-03-21 DIAGNOSIS — E782 Mixed hyperlipidemia: Secondary | ICD-10-CM | POA: Diagnosis not present

## 2021-03-21 DIAGNOSIS — Z0000 Encounter for general adult medical examination without abnormal findings: Secondary | ICD-10-CM | POA: Diagnosis not present

## 2021-03-21 DIAGNOSIS — R739 Hyperglycemia, unspecified: Secondary | ICD-10-CM | POA: Diagnosis not present

## 2021-05-18 DIAGNOSIS — H2513 Age-related nuclear cataract, bilateral: Secondary | ICD-10-CM | POA: Diagnosis not present

## 2021-06-08 ENCOUNTER — Ambulatory Visit: Payer: PPO | Admitting: Dermatology

## 2021-06-08 ENCOUNTER — Other Ambulatory Visit: Payer: Self-pay

## 2021-06-08 DIAGNOSIS — L821 Other seborrheic keratosis: Secondary | ICD-10-CM | POA: Diagnosis not present

## 2021-06-08 DIAGNOSIS — L578 Other skin changes due to chronic exposure to nonionizing radiation: Secondary | ICD-10-CM | POA: Diagnosis not present

## 2021-06-08 DIAGNOSIS — L820 Inflamed seborrheic keratosis: Secondary | ICD-10-CM | POA: Diagnosis not present

## 2021-06-08 DIAGNOSIS — I872 Venous insufficiency (chronic) (peripheral): Secondary | ICD-10-CM

## 2021-06-08 MED ORDER — TRIAMCINOLONE ACETONIDE 0.1 % EX CREA
1.0000 | TOPICAL_CREAM | CUTANEOUS | 1 refills | Status: AC
Start: 2021-06-08 — End: ?

## 2021-06-08 NOTE — Patient Instructions (Signed)

## 2021-06-08 NOTE — Progress Notes (Signed)
   Follow-Up Visit   Subjective  Allison Shelton is a 70 y.o. female who presents for the following: growths (Back x 4, hx of bleeding), check spot (R dorsum foot, 2-40m, was itchy but not now), and Rash (L pretibial, off and on for yrs, itchy, has used benadryl cream).  The following portions of the chart were reviewed this encounter and updated as appropriate:   Tobacco  Allergies  Meds  Problems  Med Hx  Surg Hx  Fam Hx     Review of Systems:  No other skin or systemic complaints except as noted in HPI or Assessment and Plan.  Objective  Well appearing patient in no apparent distress; mood and affect are within normal limits.  A focused examination was performed including back, legs, right foot. Relevant physical exam findings are noted in the Assessment and Plan.  back x 4 (4) Erythematous keratotic or waxy stuck-on papule or plaque.   Left Lower Leg - Anterior Erythematous, scaly patches involving the ankle and distal lower leg with associated lower leg edema.   R dorsum foot Pink waxy pap   Assessment & Plan  Inflamed seborrheic keratosis back x 4  Destruction of lesion - back x 4 Complexity: simple   Destruction method: cryotherapy   Informed consent: discussed and consent obtained   Timeout:  patient name, date of birth, surgical site, and procedure verified Lesion destroyed using liquid nitrogen: Yes   Region frozen until ice ball extended beyond lesion: Yes   Outcome: patient tolerated procedure well with no complications   Post-procedure details: wound care instructions given    Venous stasis dermatitis of left lower extremity Left Lower Leg - Anterior Chronic, persistent Stasis in the legs causes chronic leg swelling, which may result in itchy or painful rashes, skin discoloration, skin texture changes, and sometimes ulceration.  Recommend daily compression hose/stockings- easiest to put on first thing in morning, remove at bedtime.  Elevate legs as much as  possible. Avoid salt/sodium rich foods.  Start TMC 0.1% cr qd up to 5d/wk prn flares, avoid f/g/a  triamcinolone cream (KENALOG) 0.1 % - Left Lower Leg - Anterior Apply 1 application topically as directed. Qd up to 5 days per week prn itchy flares, avoid face, groin, axilla  Seborrheic keratosis -inflamed R dorsum foot Benign Start TMC 0.1% cr qd up to 5d/wk until inflammation resolved  Seborrheic Keratoses - Stuck-on, waxy, tan-brown papules and/or plaques  - Benign-appearing - Discussed benign etiology and prognosis. - Observe - Call for any changes  Actinic Damage - chronic, secondary to cumulative UV radiation exposure/sun exposure over time - diffuse scaly erythematous macules with underlying dyspigmentation - Recommend daily broad spectrum sunscreen SPF 30+ to sun-exposed areas, reapply every 2 hours as needed.  - Recommend staying in the shade or wearing long sleeves, sun glasses (UVA+UVB protection) and wide brim hats (4-inch brim around the entire circumference of the hat). - Call for new or changing lesions.  Return for as scheduled.  I, SOthelia Pulling RMA, am acting as scribe for DSarina Ser MD . Documentation: I have reviewed the above documentation for accuracy and completeness, and I agree with the above.  DSarina Ser MD

## 2021-06-12 ENCOUNTER — Encounter: Payer: Self-pay | Admitting: Dermatology

## 2021-06-13 ENCOUNTER — Other Ambulatory Visit: Payer: Self-pay | Admitting: Internal Medicine

## 2021-06-13 DIAGNOSIS — Z1231 Encounter for screening mammogram for malignant neoplasm of breast: Secondary | ICD-10-CM

## 2021-07-13 ENCOUNTER — Other Ambulatory Visit: Payer: Self-pay

## 2021-07-13 ENCOUNTER — Ambulatory Visit
Admission: RE | Admit: 2021-07-13 | Discharge: 2021-07-13 | Disposition: A | Discharge status: Home / Self Care | Payer: PPO | Source: Ambulatory Visit | Attending: Internal Medicine | Admitting: Internal Medicine

## 2021-07-13 DIAGNOSIS — Z1231 Encounter for screening mammogram for malignant neoplasm of breast: Secondary | ICD-10-CM | POA: Diagnosis not present

## 2022-02-22 ENCOUNTER — Ambulatory Visit: Payer: PPO | Admitting: Dermatology

## 2022-02-22 ENCOUNTER — Encounter: Payer: Self-pay | Admitting: Dermatology

## 2022-02-22 DIAGNOSIS — I872 Venous insufficiency (chronic) (peripheral): Secondary | ICD-10-CM

## 2022-02-22 DIAGNOSIS — D1800 Hemangioma unspecified site: Secondary | ICD-10-CM

## 2022-02-22 DIAGNOSIS — L578 Other skin changes due to chronic exposure to nonionizing radiation: Secondary | ICD-10-CM

## 2022-02-22 DIAGNOSIS — L918 Other hypertrophic disorders of the skin: Secondary | ICD-10-CM

## 2022-02-22 DIAGNOSIS — L814 Other melanin hyperpigmentation: Secondary | ICD-10-CM | POA: Diagnosis not present

## 2022-02-22 DIAGNOSIS — L821 Other seborrheic keratosis: Secondary | ICD-10-CM | POA: Diagnosis not present

## 2022-02-22 DIAGNOSIS — Z85828 Personal history of other malignant neoplasm of skin: Secondary | ICD-10-CM

## 2022-02-22 DIAGNOSIS — D229 Melanocytic nevi, unspecified: Secondary | ICD-10-CM

## 2022-02-22 DIAGNOSIS — L820 Inflamed seborrheic keratosis: Secondary | ICD-10-CM

## 2022-02-22 DIAGNOSIS — Z1283 Encounter for screening for malignant neoplasm of skin: Secondary | ICD-10-CM | POA: Diagnosis not present

## 2022-02-22 NOTE — Patient Instructions (Signed)
Cryotherapy Aftercare ? ?Wash gently with soap and water everyday.   ?Apply Vaseline and Band-Aid daily until healed.  ? ?Prior to procedure, discussed risks of blister formation, small wound, skin dyspigmentation, or rare scar following cryotherapy. Recommend Vaseline ointment to treated areas while healing.  ? ? ?Recommend daily broad spectrum sunscreen SPF 30+ to sun-exposed areas, reapply every 2 hours as needed. Call for new or changing lesions.  ?Staying in the shade or wearing long sleeves, sun glasses (UVA+UVB protection) and wide brim hats (4-inch brim around the entire circumference of the hat) are also recommended for sun protection.  ? ? ?Melanoma ABCDEs ? ?Melanoma is the most dangerous type of skin cancer, and is the leading cause of death from skin disease.  You are more likely to develop melanoma if you: ?Have light-colored skin, light-colored eyes, or red or blond hair ?Spend a lot of time in the sun ?Tan regularly, either outdoors or in a tanning bed ?Have had blistering sunburns, especially during childhood ?Have a close family member who has had a melanoma ?Have atypical moles or large birthmarks ? ?Early detection of melanoma is key since treatment is typically straightforward and cure rates are extremely high if we catch it early.  ? ?The first sign of melanoma is often a change in a mole or a new dark spot.  The ABCDE system is a way of remembering the signs of melanoma. ? ?A for asymmetry:  The two halves do not match. ?B for border:  The edges of the growth are irregular. ?C for color:  A mixture of colors are present instead of an even brown color. ?D for diameter:  Melanomas are usually (but not always) greater than 6m - the size of a pencil eraser. ?E for evolution:  The spot keeps changing in size, shape, and color. ? ?Please check your skin once per month between visits. You can use a small mirror in front and a large mirror behind you to keep an eye on the back side or your body.   ? ?If you see any new or changing lesions before your next follow-up, please call to schedule a visit. ? ?Please continue daily skin protection including broad spectrum sunscreen SPF 30+ to sun-exposed areas, reapplying every 2 hours as needed when you're outdoors.  ? ?Staying in the shade or wearing long sleeves, sun glasses (UVA+UVB protection) and wide brim hats (4-inch brim around the entire circumference of the hat) are also recommended for sun protection.   ? ? ?If You Need Anything After Your Visit ? ?If you have any questions or concerns for your doctor, please call our main line at [phone] press option 4 to reach your doctor's medical assistant. If no one answers, please leave a voicemail as directed and we will return your call as soon as possible. Messages left after 4 pm will be answered the following business day.  ? ?You may also send [place] message via MyChart. We typically respond to MyChart messages within 1-2 business days. ? ?For prescription refills, please ask your pharmacy to contact our office. Our fax number is [phone] ? ?If you have an urgent issue when the clinic is closed that cannot wait until the next business day, you can page your doctor at the number below.   ? ?Please note that while we do our best to be available for urgent issues outside of office hours, we are not available 24/7.  ? ?If you have an urgent issue and  are unable to reach [place], you may choose to seek medical care at your doctor's office, retail clinic, urgent care center, or emergency room. ? ?If you have a medical emergency, please immediately call 911 or go to the emergency department. ? ?Pager Numbers ? ?- Dr. Nehemiah Massed: [phone] ? ?- Dr. Laurence Ferrari: [phone] ? ?- Dr. Nicole Kindred: [phone] ? ?In the event of inclement weather, please call our main line at [phone] for an update on the status of any delays or closures. ? ?Dermatology Medication Tips: ?Please keep the boxes that topical medications  come in in order to help keep track of the instructions about where and how to use these. Pharmacies typically print the medication instructions only on the boxes and not directly on the medication tubes.  ? ?If your medication is too expensive, please contact our office at [phone] option 4 or send [place] a message through [place].  ? ?We are unable to tell what your co-pay for medications will be in advance as this is different depending on your insurance coverage. However, we may be able to find a substitute medication at lower cost or fill out paperwork to get insurance to cover a needed medication.  ? ?If a prior authorization is required to get your medication covered by your insurance company, please allow [place] 1-2 business days to complete this process. ? ?Drug prices often vary depending on where the prescription is filled and some pharmacies may offer cheaper prices. ? ?The website www.goodrx.com contains coupons for medications through different pharmacies. The prices here do not account for what the cost may be with help from insurance (it may be cheaper with your insurance), but the website can give you the price if you did not use any insurance.  ?- You can print the associated coupon and take it with your prescription to the pharmacy.  ?- You may also stop by our office during regular business hours and pick up a GoodRx coupon card.  ?- If you need your prescription sent electronically to a different pharmacy, notify our office through [facility] or by phone at [phone] option 4. ? ? ? ? ?Si Usted Necesita Algo Despu?s de Su Visita ? ?Tambi?n puede enviarnos un mensaje a trav?s de MyChart. Por lo general respondemos a los mensajes de MyChart en el transcurso de 1 a 2 d?as h?biles. ? ?Para renovar recetas, por favor pida a su farmacia que se ponga en contacto con nuestra oficina. Nuestro n?mero de fax es el [phone]. ? ?Si tiene un asunto urgente cuando la cl?nica est? cerrada y que no  puede esperar hasta el siguiente d?a h?bil, puede llamar/localizar a su doctor(a) al n?mero que aparece a continuaci?n.  ? ?Por favor, tenga en cuenta que aunque hacemos todo lo posible para estar disponibles para asuntos urgentes fuera del horario de oficina, no estamos disponibles las 24 horas del d?a, los 7 d?as de la semana.  ? ?Si tiene un problema urgente y no puede comunicarse con nosotros, puede optar por buscar atenci?n m?dica  en el consultorio de su doctor(a), en una cl?nica privada, en un centro de atenci?n urgente o en una sala de emergencias. ? ?Si tiene Engineer, maintenance (IT) m?dica, por favor llame inmediatamente al 911 o vaya a la sala de emergencias. ? ?N?meros de b?per ? ?- Dr. Nehemiah Massed: [phone] ? ?- Dra. Moye: [phone] ? ?- Dra. Nicole Kindred: [phone] ? ?En caso de inclemencias del tiempo, por favor llame a nuestra l?nea principal al [phone] para una actualizaci?n [employer]  estado de cualquier retraso o cierre. ? ?Consejos para la medicaci?n en dermatolog?a: ?Por favor, guarde las cajas en las que vienen los medicamentos de uso t?pico para ayudarle a seguir las instrucciones sobre d?nde y c?mo usarlos. Las farmacias generalmente imprimen las instrucciones del medicamento s?lo en las cajas y no directamente en los tubos del [place].  ? ?Si su medicamento es muy caro, por favor, p?ngase en contacto con Zigmund Daniel llamando al [phone] y presione la opci?n 4 o env?enos un mensaje a trav?s de MyChart.  ? ?No podemos decirle cu?l ser? su copago por los medicamentos por adelantado ya que esto es diferente dependiendo de la cobertura de su seguro. Sin embargo, es posible que podamos encontrar un medicamento sustituto a Electrical engineer un formulario para que el seguro cubra el medicamento que se considera necesario.  ? ?Si se requiere Ardelia Mems autorizaci?n previa para que su compa??a de seguros [place] su medicamento, por favor perm?tanos de 1 a 2 d?as h?biles para completar este  proceso. ? ?Los precios de los medicamentos var?an con frecuencia dependiendo del Environmental consultant de d?nde se surte la receta y alguna farmacias pueden ofrecer precios m?s baratos. ? ?El sitio web www.goodrx.com tiene cupones

## 2022-02-22 NOTE — Progress Notes (Signed)
Follow-Up Visit   Subjective  Allison Shelton is a 71 y.o. female who presents for the following: Annual Exam (Skin cancer screening. Full body. Hx of SCC. Bothersome brown, rough areas at scattered torso). The patient presents for Total-Body Skin Exam (TBSE) for skin cancer screening and mole check.  The patient has spots, moles and lesions to be evaluated, some may be new or changing and the patient has concerns that these could be cancer.  The following portions of the chart were reviewed this encounter and updated as appropriate:  Tobacco  Allergies  Meds  Problems  Med Hx  Surg Hx  Fam Hx     Review of Systems: No other skin or systemic complaints except as noted in HPI or Assessment and Plan.  Objective  Well appearing patient in no apparent distress; mood and affect are within normal limits.  A full examination was performed including scalp, head, eyes, ears, nose, lips, neck, chest, axillae, abdomen, back, buttocks, bilateral upper extremities, bilateral lower extremities, hands, feet, fingers, toes, fingernails, and toenails. All findings within normal limits unless otherwise noted below.  Right temple x1, right distal thigh x1 (2) Erythematous keratotic or waxy stuck-on papule or plaque.  Left Lower Leg - Anterior Erythematous, scaly patches involving the ankle and distal lower leg with associated lower leg edema.    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  - No evidence of recurrence today - No lymphadenopathy - Recommend regular full body skin exams - Recommend daily broad spectrum sunscreen SPF 30+ to sun-exposed areas, reapply every 2 hours as needed.  - Call if any new or changing lesions are noted between office visits  Lentigines - Scattered tan macules - Due to sun exposure - Benign-appearing, observe - Recommend daily broad spectrum sunscreen SPF 30+ to sun-exposed areas, reapply every 2  hours as needed. - Call for any changes  Seborrheic Keratoses - Stuck-on, waxy, tan-brown papules and/or plaques  - Benign-appearing - Discussed benign etiology and prognosis. - Observe - Call for any changes  Melanocytic Nevi - Tan-brown and/or pink-flesh-colored symmetric macules and papules - Benign appearing on exam today - Observation - Call clinic for new or changing moles - Recommend daily use of broad spectrum spf 30+ sunscreen to sun-exposed areas.   Hemangiomas - Red papules - Discussed benign nature - Observe - Call for any changes  Actinic Damage - Chronic condition, secondary to cumulative UV/sun exposure - diffuse scaly erythematous macules with underlying dyspigmentation - Recommend daily broad spectrum sunscreen SPF 30+ to sun-exposed areas, reapply every 2 hours as needed.  - Staying in the shade or wearing long sleeves, sun glasses (UVA+UVB protection) and wide brim hats (4-inch brim around the entire circumference of the hat) are also recommended for sun protection.  - Call for new or changing lesions.  Acrochordons (Skin Tags) - Fleshy, skin-colored pedunculated papules - Benign appearing.  - Observe. - If desired, they can be removed with an in office procedure that is not covered by insurance. - Please call the clinic if you notice any new or changing lesions.   Skin cancer screening performed today.  Inflamed seborrheic keratosis (2) Right temple x1, right distal thigh x1 Destruction of lesion - Right temple x1, right distal thigh x1 Complexity: simple   Destruction method: cryotherapy   Informed consent: discussed and consent obtained   Timeout:  patient name, date of birth, surgical site, and procedure verified  Lesion destroyed using liquid nitrogen: Yes   Region frozen until ice ball extended beyond lesion: Yes   Outcome: patient tolerated procedure well with no complications   Post-procedure details: wound care instructions given    Venous  stasis dermatitis of left lower extremity Left Lower Leg - Anterior Stasis in the legs causes chronic leg swelling, which may result in itchy or painful rashes, skin discoloration, skin texture changes, and sometimes ulceration.  Recommend daily graduated compression hose/stockings- easiest to put on first thing in morning, remove at bedtime.  Elevate legs as much as possible. Avoid salt/sodium rich foods.  Continue Triamcinolone cream twice daily as directed.   Related Medications triamcinolone cream (KENALOG) 0.1 % Apply 1 application topically as directed. Qd up to 5 days per week prn itchy flares, avoid face, groin, axilla  Skin cancer screening  Return in about 1 year (around 02/23/2023) for TBSE, HxSCC.  I, Emelia Salisbury, CMA, am acting as scribe for Sarina Ser, MD. Documentation: I have reviewed the above documentation for accuracy and completeness, and I agree with the above.  Sarina Ser, MD

## 2022-03-12 ENCOUNTER — Encounter: Payer: Self-pay | Admitting: Dermatology

## 2022-03-29 DIAGNOSIS — E782 Mixed hyperlipidemia: Secondary | ICD-10-CM | POA: Diagnosis not present

## 2022-03-29 DIAGNOSIS — E538 Deficiency of other specified B group vitamins: Secondary | ICD-10-CM | POA: Diagnosis not present

## 2022-03-29 DIAGNOSIS — R739 Hyperglycemia, unspecified: Secondary | ICD-10-CM | POA: Diagnosis not present

## 2022-04-02 DIAGNOSIS — R739 Hyperglycemia, unspecified: Secondary | ICD-10-CM | POA: Diagnosis not present

## 2022-04-02 DIAGNOSIS — E538 Deficiency of other specified B group vitamins: Secondary | ICD-10-CM | POA: Diagnosis not present

## 2022-04-02 DIAGNOSIS — Z0000 Encounter for general adult medical examination without abnormal findings: Secondary | ICD-10-CM | POA: Diagnosis not present

## 2022-04-02 DIAGNOSIS — E782 Mixed hyperlipidemia: Secondary | ICD-10-CM | POA: Diagnosis not present

## 2022-05-22 DIAGNOSIS — H43813 Vitreous degeneration, bilateral: Secondary | ICD-10-CM | POA: Diagnosis not present

## 2022-05-22 DIAGNOSIS — H2513 Age-related nuclear cataract, bilateral: Secondary | ICD-10-CM | POA: Diagnosis not present

## 2022-06-13 ENCOUNTER — Other Ambulatory Visit: Payer: Self-pay | Admitting: Internal Medicine

## 2022-06-13 DIAGNOSIS — Z1231 Encounter for screening mammogram for malignant neoplasm of breast: Secondary | ICD-10-CM

## 2022-06-20 DIAGNOSIS — R0600 Dyspnea, unspecified: Secondary | ICD-10-CM | POA: Diagnosis not present

## 2022-06-20 DIAGNOSIS — U099 Post covid-19 condition, unspecified: Secondary | ICD-10-CM | POA: Diagnosis not present

## 2022-06-20 DIAGNOSIS — R0602 Shortness of breath: Secondary | ICD-10-CM | POA: Diagnosis not present

## 2022-06-20 DIAGNOSIS — R0609 Other forms of dyspnea: Secondary | ICD-10-CM | POA: Diagnosis not present

## 2022-07-26 ENCOUNTER — Ambulatory Visit
Admission: RE | Admit: 2022-07-26 | Discharge: 2022-07-26 | Disposition: A | Discharge status: Home / Self Care | Payer: PPO | Source: Ambulatory Visit | Attending: Internal Medicine | Admitting: Internal Medicine

## 2022-07-26 DIAGNOSIS — Z1231 Encounter for screening mammogram for malignant neoplasm of breast: Secondary | ICD-10-CM | POA: Insufficient documentation

## 2022-12-05 ENCOUNTER — Encounter: Payer: Self-pay | Admitting: Emergency Medicine

## 2022-12-05 ENCOUNTER — Emergency Department: Payer: PPO

## 2022-12-05 ENCOUNTER — Emergency Department
Admission: EM | Admit: 2022-12-05 | Discharge: 2022-12-05 | Disposition: A | Discharge status: Home / Self Care | Payer: PPO | Attending: Emergency Medicine | Admitting: Emergency Medicine

## 2022-12-05 DIAGNOSIS — M25531 Pain in right wrist: Secondary | ICD-10-CM | POA: Diagnosis not present

## 2022-12-05 DIAGNOSIS — M79601 Pain in right arm: Secondary | ICD-10-CM | POA: Diagnosis not present

## 2022-12-05 DIAGNOSIS — M25511 Pain in right shoulder: Secondary | ICD-10-CM | POA: Diagnosis not present

## 2022-12-05 DIAGNOSIS — W010XXA Fall on same level from slipping, tripping and stumbling without subsequent striking against object, initial encounter: Secondary | ICD-10-CM | POA: Diagnosis not present

## 2022-12-05 MED ORDER — OXYCODONE-ACETAMINOPHEN 5-325 MG PO TABS
1.0000 | ORAL_TABLET | Freq: Once | ORAL | Status: DC
  Filled 2022-12-05: qty 1

## 2022-12-05 MED ORDER — OXYCODONE-ACETAMINOPHEN 5-325 MG PO TABS: 1.0000 | ORAL_TABLET | Freq: Four times a day (QID) | ORAL | 0 refills | Status: AC | PRN

## 2022-12-05 MED ORDER — ONDANSETRON 4 MG PO TBDP: 4.0000 mg | ORAL_TABLET | Freq: Three times a day (TID) | ORAL | 0 refills | Status: AC | PRN

## 2022-12-05 MED ORDER — ONDANSETRON 4 MG PO TBDP
4.0000 mg | ORAL_TABLET | Freq: Once | ORAL | Status: DC
  Filled 2022-12-05: qty 1

## 2022-12-05 NOTE — ED Provider Notes (Signed)
[facility] Provider Note  Patient Contact: 8:50 PM (approximate)   History   Shoulder Pain   HPI  Allison Shelton is a 72 y.o. female presents to the emergency department primarily with right-sided shoulder pain after mechanical fall tonight.  Patient fell after she tripped on her son's scooter.  Patient denies hitting her head or her neck.  No numbness or tingling in the upper and lower extremities.  Patient is having difficulty performing abduction of the right shoulder.  No chest pain, chest tightness, shortness of breath or abdominal pain.      Physical Exam   Triage Vital Signs: ED Triage Vitals  Enc Vitals Group     BP 12/05/22 1909 (!) 171/88     Pulse Rate 12/05/22 1909 83     Resp 12/05/22 1909 18     Temp 12/05/22 1909 98.8 F (37.1 C)     Temp Source 12/05/22 1909 Oral     SpO2 12/05/22 1909 99 %     Weight 12/05/22 1910 190 lb (86.2 kg)     Height 12/05/22 1910 4\' 11"  (1.499 m)     Head Circumference --      Peak Flow --      Pain Score 12/05/22 1917 7     Pain Loc --      Pain Edu? --      Excl. in GC? --     Most recent vital signs: Vitals:   12/05/22 1909  BP: (!) 171/88  Pulse: 83  Resp: 18  Temp: 98.8 F (37.1 C)  SpO2: 99%     General: Alert and in no acute distress. Eyes:  PERRL. EOMI. Head: No acute traumatic findings ENT:      Nose: No congestion/rhinnorhea.      Mouth/Throat: Mucous membranes are moist. Neck: No stridor. No cervical spine tenderness to palpation. Cardiovascular:  Good peripheral perfusion Respiratory: Normal respiratory effort without tachypnea or retractions. Lungs CTAB. Good air entry to the bases with no decreased or absent breath sounds. Gastrointestinal: Bowel sounds 4 quadrants. Soft and nontender to palpation. No guarding or rigidity. No palpable masses. No distention. No CVA tenderness. Musculoskeletal: Patient has difficulty performing range of motion of the right shoulder.  She  performs limited range of motion of the right wrist and right elbow.  Palpable radial and ulnar pulses bilaterally and symmetrically. Neurologic:  No gross focal neurologic deficits are appreciated.  Skin:   No rash noted Other:   ED Results / Procedures / Treatments   Labs (all labs ordered are listed, but only abnormal results are displayed) Labs Reviewed - No data to display     PROCEDURES:  Critical Care performed: No  Procedures   MEDICATIONS ORDERED IN ED: Medications - No data to display   IMPRESSION / MDM / ASSESSMENT AND PLAN / ED COURSE  I reviewed the triage vital signs and the nursing notes.                              Assessment and plan Right shoulder pain 72 year old female presents to the emergency department with acute right shoulder pain after a fall.  X-rays unremarkable.  Concern for some right rotator cuff weakness appreciated on exam.  Patient was placed in a sling and I recommended follow-up with orthopedics, Dr. Signa Kell.  Patient was prescribed a short course of Percocet for pain.  Return precautions were given to  return with new or worsening symptoms.     FINAL CLINICAL IMPRESSION(S) / ED DIAGNOSES   Final diagnoses:  Acute pain of right shoulder     Rx / DC Orders   ED Discharge Orders          Ordered    oxyCODONE-acetaminophen (PERCOCET/ROXICET) 5-325 MG tablet  Every 6 hours PRN        12/05/22 2048    ondansetron (ZOFRAN-ODT) 4 MG disintegrating tablet  Every 8 hours PRN        12/05/22 2048             Note:  This document was prepared using Dragon voice recognition software and may include unintentional dictation errors.   Pia Mau [place], Cordelia Poche 12/05/22 2053    Shaune Pollack, MD 12/06/22 [phone]

## 2022-12-05 NOTE — ED Notes (Signed)
Pt refused pain and nausea medications. "I just like taking motrin and I will take it at home"

## 2022-12-05 NOTE — ED Triage Notes (Signed)
Pt presents via POV with complaints of R shoulder pain following a mechanical fall tonight. Pt slipped and fell, landing on her R shoulder. Tenderness with palpation - equal grip strength - limited ROM. Denies hitting her head, no LOC, dizziness, CP, SOB.

## 2022-12-05 NOTE — Discharge Instructions (Signed)
You can take Percocet every 6 hours for pain Zofran.

## 2022-12-12 DIAGNOSIS — R29898 Other symptoms and signs involving the musculoskeletal system: Secondary | ICD-10-CM | POA: Diagnosis not present

## 2022-12-12 DIAGNOSIS — M25511 Pain in right shoulder: Secondary | ICD-10-CM | POA: Diagnosis not present

## 2022-12-13 ENCOUNTER — Other Ambulatory Visit: Payer: Self-pay | Admitting: Orthopedic Surgery

## 2022-12-13 DIAGNOSIS — R29898 Other symptoms and signs involving the musculoskeletal system: Secondary | ICD-10-CM

## 2022-12-13 DIAGNOSIS — M25511 Pain in right shoulder: Secondary | ICD-10-CM

## 2022-12-27 ENCOUNTER — Ambulatory Visit
Admission: RE | Admit: 2022-12-27 | Discharge: 2022-12-27 | Disposition: A | Discharge status: Home / Self Care | Payer: PPO | Source: Ambulatory Visit | Attending: Orthopedic Surgery | Admitting: Orthopedic Surgery

## 2022-12-27 DIAGNOSIS — M25511 Pain in right shoulder: Secondary | ICD-10-CM

## 2022-12-27 DIAGNOSIS — R29898 Other symptoms and signs involving the musculoskeletal system: Secondary | ICD-10-CM

## 2023-01-03 DIAGNOSIS — S46011A Strain of muscle(s) and tendon(s) of the rotator cuff of right shoulder, initial encounter: Secondary | ICD-10-CM | POA: Diagnosis not present

## 2023-01-04 ENCOUNTER — Encounter
Admission: RE | Admit: 2023-01-04 | Discharge: 2023-01-04 | Disposition: A | Discharge status: Home / Self Care | Payer: PPO | Source: Ambulatory Visit | Attending: Orthopedic Surgery | Admitting: Orthopedic Surgery

## 2023-01-04 ENCOUNTER — Other Ambulatory Visit: Payer: Self-pay | Admitting: Orthopedic Surgery

## 2023-01-04 ENCOUNTER — Other Ambulatory Visit: Payer: Self-pay

## 2023-01-04 VITALS — Ht 59.0 in | Wt 190.0 lb

## 2023-01-04 DIAGNOSIS — I10 Essential (primary) hypertension: Secondary | ICD-10-CM

## 2023-01-04 NOTE — Patient Instructions (Addendum)
Your procedure is scheduled on: Tuesday January 08, 2023. Report to the Registration Desk on the 1st floor of the [facility]. To find out your arrival time, please call [phone] between 1PM - 3PM on: Monday January 07, 2023. If your arrival time is 6:00 am, do not arrive before that time as the [place] entrance doors do not open until 6:00 am.  REMEMBER: Instructions that are not followed completely may result in serious medical risk, up to and including death; or upon the discretion of your surgeon and anesthesiologist your surgery may need to be rescheduled.  Do not eat food after midnight the night before surgery.  No gum chewing or hard candies.  You may however, drink CLEAR liquids up to 2 hours before you are scheduled to arrive for your surgery. Do not drink anything within 2 hours of your scheduled arrival time.  Clear liquids include: - water  - apple juice without pulp - gatorade (not RED colors) - black coffee or tea (Do NOT add milk or creamers to the coffee or tea) Do NOT drink anything that is not on this list.  In addition, your doctor has ordered for you to drink the provided:  Ensure Pre-Surgery Clear Carbohydrate Drink  Drinking this carbohydrate drink up to two hours before surgery helps to reduce insulin resistance and improve patient outcomes. Please complete drinking 2 hours before scheduled arrival time.  One week prior to surgery: Stop Anti-inflammatories (NSAIDS) such as Advil, Aleve, Ibuprofen, Motrin, Naproxen, Naprosyn and Aspirin based products such as Excedrin, Goody's Powder, BC Powder. Stop ALL OVER THE COUNTER supplements until after surgery.Cyanocobalamin (VITAMIN B 12 PO)  You may however, continue to take Tylenol if needed for pain up until the day of surgery.  Follow recommendations from Cardiologist or PCP regarding stopping blood thinners.  TAKE ONLY THESE MEDICATIONS THE MORNING OF SURGERY WITH A SIP OF WATER:  None    No Alcohol  for 24 hours before or after surgery.  No Smoking including e-cigarettes for 24 hours before surgery.  No chewable tobacco products for at least 6 hours before surgery.  No nicotine patches on the day of surgery.  Do not use any "recreational" drugs for at least a week (preferably 2 weeks) before your surgery.  Please be advised that the combination of cocaine and anesthesia may have negative outcomes, up to and including death. If you test positive for cocaine, your surgery will be cancelled.  On the morning of surgery brush your teeth with toothpaste and water, you may rinse your mouth with mouthwash if you wish. Do not swallow any toothpaste or mouthwash.  Use CHG Soap or wipes as directed on instruction sheet.  Do not wear jewelry, make-up, hairpins, clips or nail polish.  Do not wear lotions, powders, or perfumes.   Do not shave body hair from the neck down 48 hours before surgery.  Contact lenses, hearing aids and dentures may not be worn into surgery.  Do not bring valuables to the hospital. [facility] is not responsible for any missing/lost belongings or valuables.   Notify your doctor if there is any change in your medical condition (cold, fever, infection).  Wear comfortable clothing (specific to your surgery type) to the hospital.  After surgery, you can help prevent lung complications by doing breathing exercises.  Take deep breaths and cough every 1-2 hours. Your doctor may order a device called an Incentive Spirometer to help you take deep breaths. When coughing or sneezing, hold  a pillow firmly against your incision with both hands. This is called "splinting." Doing this helps protect your incision. It also decreases belly discomfort.  If you are being admitted to the hospital overnight, leave your suitcase in the car. After surgery it may be brought to your room.  In case of increased patient census, it may be necessary for you, the patient, to continue your  postoperative care in the Same Day Surgery department.  If you are being discharged the day of surgery, you will not be allowed to drive home. You will need a responsible individual to drive you home and stay with you for 24 hours after surgery.   If you are taking public transportation, you will need to have a responsible individual with you.  Please call the [place] Dept. at [phone] if you have any questions about these instructions.  Surgery Visitation Policy:  Patients undergoing a surgery or procedure may have two family members or support persons with them as long as the person is not COVID-19 positive or experiencing its symptoms.   Inpatient Visitation:    Visiting hours are 7 a.m. to 8 p.m. Up to four visitors are allowed at one time in a patient room. The visitors may rotate out with other people during the day. One designated support person (adult) may remain overnight.  Due to an increase in RSV and influenza rates and associated hospitalizations, children ages 53 and under will not be able to visit patients in [facility]. Masks continue to be strongly recommended.    Preparing for Surgery with CHLORHEXIDINE GLUCONATE (CHG) Soap  Chlorhexidine Gluconate (CHG) Soap  o An antiseptic cleaner that kills germs and bonds with the skin to continue killing germs even after washing  o Used for showering the night before surgery and morning of surgery  Before surgery, you can play an important role by reducing the number of germs on your skin.  CHG (Chlorhexidine gluconate) soap is an antiseptic cleanser which kills germs and bonds with the skin to continue killing germs even after washing.  Please do not use if you have an allergy to CHG or antibacterial soaps. If your skin becomes reddened/irritated stop using the CHG.  1. Shower the NIGHT BEFORE SURGERY and the MORNING OF SURGERY with CHG soap.  2. If you choose to wash your hair, wash your  hair first as usual with your normal shampoo.  3. After shampooing, rinse your hair and body thoroughly to remove the shampoo.  4. Use CHG as you would any other liquid soap. You can apply CHG directly to the skin and wash gently with a scrungie or a clean washcloth.  5. Apply the CHG soap to your body only from the neck down. Do not use on open wounds or open sores. Avoid contact with your eyes, ears, mouth, and genitals (private parts). Wash face and genitals (private parts) with your normal soap.  6. Wash thoroughly, paying special attention to the area where your surgery will be performed.  7. Thoroughly rinse your body with warm water.  8. Do not shower/wash with your normal soap after using and rinsing off the CHG soap.  9. Pat yourself dry with a clean towel.  10. Wear clean pajamas to bed the night before surgery.  12. Place clean sheets on your bed the night of your first shower and do not sleep with pets.  13. Shower again with the CHG soap on the day of surgery prior to arriving  at the hospital.  14. Do not apply any deodorants/lotions/powders.  15. Please wear clean clothes to the hospital.How to Use an Incentive Spirometer  An incentive spirometer is a tool that measures how well you are filling your lungs with each breath. Learning to take long, deep breaths using this tool can help you keep your lungs clear and active. This may help to reverse or lessen your chance of developing breathing (pulmonary) problems, especially infection. You may be asked to use a spirometer: After a surgery. If you have a lung problem or a history of smoking. After a long period of time when you have been unable to move or be active. If the spirometer includes an indicator to show the highest number that you have reached, your health care provider or respiratory therapist will help you set a goal. Keep a log of your progress as told by your health care provider. What are the risks? Breathing  too quickly may cause dizziness or cause you to pass out. Take your time so you do not get dizzy or light-headed. If you are in pain, you may need to take pain medicine before doing incentive spirometry. It is harder to take a deep breath if you are having pain. How to use your incentive spirometer  Sit up on the edge of your bed or on a chair. Hold the incentive spirometer so that it is in an upright position. Before you use the spirometer, breathe out normally. Place the mouthpiece in your mouth. Make sure your lips are closed tightly around it. Breathe in slowly and as deeply as you can through your mouth, causing the piston or the ball to rise toward the top of the chamber. Hold your breath for 3-5 seconds, or for as long as possible. If the spirometer includes a coach indicator, use this to guide you in breathing. Slow down your breathing if the indicator goes above the marked areas. Remove the mouthpiece from your mouth and breathe out normally. The piston or ball will return to the bottom of the chamber. Rest for a few seconds, then repeat the steps 10 or more times. Take your time and take a few normal breaths between deep breaths so that you do not get dizzy or light-headed. Do this every 1-2 hours when you are awake. If the spirometer includes a goal marker to show the highest number you have reached (best effort), use this as a goal to work toward during each repetition. After each set of 10 deep breaths, cough a few times. This will help to make sure that your lungs are clear. If you have an incision on your chest or abdomen from surgery, place a pillow or a rolled-up towel firmly against the incision when you cough. This can help to reduce pain while taking deep breaths and coughing. General tips When you are able to get out of bed: Walk around often. Continue to take deep breaths and cough in order to clear your lungs. Keep using the incentive spirometer until your health care  provider says it is okay to stop using it. If you have been in the hospital, you may be told to keep using the spirometer at home. Contact a health care provider if: You are having difficulty using the spirometer. You have trouble using the spirometer as often as instructed. Your pain medicine is not giving enough relief for you to use the spirometer as told. You have a fever. Get help right away if: You develop shortness of  breath. You develop a cough with bloody mucus from the lungs. You have fluid or blood coming from an incision site after you cough. Summary An incentive spirometer is a tool that can help you learn to take long, deep breaths to keep your lungs clear and active. You may be asked to use a spirometer after a surgery, if you have a lung problem or a history of smoking, or if you have been inactive for a long period of time. Use your incentive spirometer as instructed every 1-2 hours while you are awake. If you have an incision on your chest or abdomen, place a pillow or a rolled-up towel firmly against your incision when you cough. This will help to reduce pain. Get help right away if you have shortness of breath, you cough up bloody mucus, or blood comes from your incision when you cough. This information is not intended to replace advice given to you by your health care provider. Make sure you discuss any questions you have with your health care provider. Document Revised: 12/28/2019 Document Reviewed: 12/28/2019 Elsevier Patient Education  [place].

## 2023-01-07 ENCOUNTER — Other Ambulatory Visit: Payer: PPO

## 2023-01-07 ENCOUNTER — Encounter
Admission: RE | Admit: 2023-01-07 | Discharge: 2023-01-07 | Disposition: A | Discharge status: Home / Self Care | Payer: PPO | Source: Ambulatory Visit | Attending: Orthopedic Surgery | Admitting: Orthopedic Surgery

## 2023-01-07 DIAGNOSIS — Z01818 Encounter for other preprocedural examination: Secondary | ICD-10-CM | POA: Insufficient documentation

## 2023-01-07 DIAGNOSIS — I10 Essential (primary) hypertension: Secondary | ICD-10-CM

## 2023-01-07 HISTORY — DX: Unspecified right bundle-branch block: I45.10

## 2023-01-07 LAB — CBC
HCT: 39.2 % (ref 36.0–46.0)
Hemoglobin: 12.8 g/dL (ref 12.0–15.0)
MCH: 27.9 pg (ref 26.0–34.0)
MCHC: 32.7 g/dL (ref 30.0–36.0)
MCV: 85.4 fL (ref 80.0–100.0)
Platelets: 221 10*3/uL (ref 150–400)
RBC: 4.59 MIL/uL (ref 3.87–5.11)
RDW: 14.2 % (ref 11.5–15.5)
WBC: 5.7 10*3/uL (ref 4.0–10.5)
nRBC: 0 % (ref 0.0–0.2)

## 2023-01-07 LAB — BASIC METABOLIC PANEL
Anion gap: 9 (ref 5–15)
BUN: 24 mg/dL — ABNORMAL HIGH (ref 8–23)
CO2: 25 mmol/L (ref 22–32)
Calcium: 9.3 mg/dL (ref 8.9–10.3)
Chloride: 106 mmol/L (ref 98–111)
Creatinine, Ser: 0.86 mg/dL (ref 0.44–1.00)
GFR, Estimated: >60 mL/min (ref >60)
Glucose, Bld: 109 mg/dL — ABNORMAL HIGH (ref 70–99)
Potassium: 3.8 mmol/L (ref 3.5–5.1)
Sodium: 140 mmol/L (ref 135–145)

## 2023-01-08 ENCOUNTER — Encounter
Admission: RE | Disposition: A | Discharge status: Home / Self Care | Payer: Self-pay | Source: Home / Self Care | Attending: Orthopedic Surgery

## 2023-01-08 ENCOUNTER — Ambulatory Visit: Payer: PPO | Admitting: Urgent Care

## 2023-01-08 ENCOUNTER — Ambulatory Visit
Admission: RE | Admit: 2023-01-08 | Discharge: 2023-01-08 | Disposition: A | Discharge status: Home / Self Care | Payer: PPO | Attending: Orthopedic Surgery | Admitting: Orthopedic Surgery

## 2023-01-08 ENCOUNTER — Other Ambulatory Visit: Payer: Self-pay

## 2023-01-08 ENCOUNTER — Ambulatory Visit: Payer: PPO

## 2023-01-08 ENCOUNTER — Encounter: Payer: Self-pay | Admitting: Orthopedic Surgery

## 2023-01-08 ENCOUNTER — Ambulatory Visit: Payer: PPO | Admitting: Anesthesiology

## 2023-01-08 DIAGNOSIS — M19011 Primary osteoarthritis, right shoulder: Secondary | ICD-10-CM | POA: Insufficient documentation

## 2023-01-08 DIAGNOSIS — J45909 Unspecified asthma, uncomplicated: Secondary | ICD-10-CM | POA: Insufficient documentation

## 2023-01-08 DIAGNOSIS — W010XXA Fall on same level from slipping, tripping and stumbling without subsequent striking against object, initial encounter: Secondary | ICD-10-CM | POA: Insufficient documentation

## 2023-01-08 DIAGNOSIS — S46211A Strain of muscle, fascia and tendon of other parts of biceps, right arm, initial encounter: Secondary | ICD-10-CM | POA: Insufficient documentation

## 2023-01-08 DIAGNOSIS — M7581 Other shoulder lesions, right shoulder: Secondary | ICD-10-CM | POA: Diagnosis not present

## 2023-01-08 DIAGNOSIS — Z6838 Body mass index (BMI) 38.0-38.9, adult: Secondary | ICD-10-CM | POA: Diagnosis not present

## 2023-01-08 DIAGNOSIS — Z87891 Personal history of nicotine dependence: Secondary | ICD-10-CM | POA: Insufficient documentation

## 2023-01-08 DIAGNOSIS — I4510 Unspecified right bundle-branch block: Secondary | ICD-10-CM | POA: Diagnosis not present

## 2023-01-08 DIAGNOSIS — S46011A Strain of muscle(s) and tendon(s) of the rotator cuff of right shoulder, initial encounter: Secondary | ICD-10-CM | POA: Diagnosis not present

## 2023-01-08 DIAGNOSIS — E669 Obesity, unspecified: Secondary | ICD-10-CM | POA: Diagnosis not present

## 2023-01-08 DIAGNOSIS — Z85828 Personal history of other malignant neoplasm of skin: Secondary | ICD-10-CM | POA: Insufficient documentation

## 2023-01-08 DIAGNOSIS — S46019A Strain of muscle(s) and tendon(s) of the rotator cuff of unspecified shoulder, initial encounter: Secondary | ICD-10-CM | POA: Insufficient documentation

## 2023-01-08 DIAGNOSIS — M25811 Other specified joint disorders, right shoulder: Secondary | ICD-10-CM | POA: Insufficient documentation

## 2023-01-08 DIAGNOSIS — I10 Essential (primary) hypertension: Secondary | ICD-10-CM | POA: Insufficient documentation

## 2023-01-08 DIAGNOSIS — Z79899 Other long term (current) drug therapy: Secondary | ICD-10-CM | POA: Diagnosis not present

## 2023-01-08 DIAGNOSIS — M75121 Complete rotator cuff tear or rupture of right shoulder, not specified as traumatic: Secondary | ICD-10-CM | POA: Diagnosis not present

## 2023-01-08 DIAGNOSIS — M7541 Impingement syndrome of right shoulder: Secondary | ICD-10-CM | POA: Diagnosis not present

## 2023-01-08 SURGERY — SHOULDER ARTHROSCOPY WITH SUBACROMIAL DECOMPRESSION AND DISTAL CLAVICLE EXCISION
Anesthesia: General | Site: Shoulder | Laterality: Right

## 2023-01-08 MED ORDER — LACTATED RINGERS IR SOLN
Status: DC | PRN
  Administered 2023-01-08 (×4): 3001 mL

## 2023-01-08 MED ORDER — ONDANSETRON 4 MG PO TBDP: 4.0000 mg | ORAL_TABLET | Freq: Three times a day (TID) | ORAL | 0 refills | Status: AC | PRN

## 2023-01-08 MED ORDER — OXYCODONE HCL 5 MG/5ML PO SOLN: 5.0000 mg | Freq: Once | ORAL | Status: DC | PRN

## 2023-01-08 MED ORDER — PROPOFOL 10 MG/ML IV BOLUS
INTRAVENOUS | Status: DC | PRN
  Administered 2023-01-08: 100 mg via INTRAVENOUS

## 2023-01-08 MED ORDER — LACTATED RINGERS IR SOLN
Status: DC | PRN
  Administered 2023-01-08 (×14): 3000 mL

## 2023-01-08 MED ORDER — ACETAMINOPHEN 10 MG/ML IV SOLN
INTRAVENOUS | Status: DC | PRN
  Administered 2023-01-08: 1000 mg via INTRAVENOUS

## 2023-01-08 MED ORDER — FAMOTIDINE 20 MG PO TABS: 20.0000 mg | ORAL_TABLET | Freq: Once | ORAL | Status: AC

## 2023-01-08 MED ORDER — OXYCODONE HCL 5 MG PO TABS: 5.0000 mg | ORAL_TABLET | Freq: Once | ORAL | Status: DC | PRN

## 2023-01-08 MED ORDER — BUPIVACAINE HCL (PF) 0.5 % IJ SOLN
INTRAMUSCULAR | Status: AC
  Filled 2023-01-08: qty 10

## 2023-01-08 MED ORDER — KETAMINE HCL 50 MG/5ML IJ SOSY
PREFILLED_SYRINGE | INTRAMUSCULAR | Status: AC
  Filled 2023-01-08: qty 5

## 2023-01-08 MED ORDER — KETAMINE HCL 10 MG/ML IJ SOLN
INTRAMUSCULAR | Status: DC | PRN
  Administered 2023-01-08: 30 mg via INTRAVENOUS

## 2023-01-08 MED ORDER — EPINEPHRINE PF 1 MG/ML IJ SOLN
INTRAMUSCULAR | Status: AC
  Filled 2023-01-08: qty 4

## 2023-01-08 MED ORDER — FENTANYL CITRATE PF 50 MCG/ML IJ SOSY
50.0000 ug | PREFILLED_SYRINGE | Freq: Once | INTRAMUSCULAR | Status: AC
  Administered 2023-01-08: 50 ug via INTRAVENOUS

## 2023-01-08 MED ORDER — ORAL CARE MOUTH RINSE: 15.0000 mL | Freq: Once | OROMUCOSAL | Status: AC

## 2023-01-08 MED ORDER — ACETAMINOPHEN 500 MG PO TABS: 1000.0000 mg | ORAL_TABLET | Freq: Three times a day (TID) | ORAL | 2 refills | Status: AC

## 2023-01-08 MED ORDER — ROCURONIUM BROMIDE 100 MG/10ML IV SOLN
INTRAVENOUS | Status: DC | PRN
  Administered 2023-01-08: 50 mg via INTRAVENOUS

## 2023-01-08 MED ORDER — BUPIVACAINE LIPOSOME 1.3 % IJ SUSP
INTRAMUSCULAR | Status: AC
  Filled 2023-01-08: qty 10

## 2023-01-08 MED ORDER — ONDANSETRON HCL 4 MG/2ML IJ SOLN
INTRAMUSCULAR | Status: AC
  Filled 2023-01-08: qty 2

## 2023-01-08 MED ORDER — BUPIVACAINE HCL (PF) 0.5 % IJ SOLN
INTRAMUSCULAR | Status: DC | PRN
  Administered 2023-01-08: 10 mL

## 2023-01-08 MED ORDER — CEFAZOLIN SODIUM-DEXTROSE 2-4 GM/100ML-% IV SOLN
2.0000 g | INTRAVENOUS | Status: AC
  Administered 2023-01-08: 2 g via INTRAVENOUS

## 2023-01-08 MED ORDER — ONDANSETRON HCL 4 MG/2ML IJ SOLN
INTRAMUSCULAR | Status: DC | PRN
  Administered 2023-01-08: 4 mg via INTRAVENOUS

## 2023-01-08 MED ORDER — DEXMEDETOMIDINE HCL IN NACL 80 MCG/20ML IV SOLN
INTRAVENOUS | Status: DC | PRN
  Administered 2023-01-08: 8 ug via BUCCAL

## 2023-01-08 MED ORDER — ASPIRIN 325 MG PO TBEC: 325.0000 mg | DELAYED_RELEASE_TABLET | Freq: Every day | ORAL | 0 refills | Status: AC

## 2023-01-08 MED ORDER — FENTANYL CITRATE PF 50 MCG/ML IJ SOSY
PREFILLED_SYRINGE | INTRAMUSCULAR | Status: AC
  Filled 2023-01-08: qty 1

## 2023-01-08 MED ORDER — FENTANYL CITRATE (PF) 100 MCG/2ML IJ SOLN: 25.0000 ug | INTRAMUSCULAR | Status: DC | PRN

## 2023-01-08 MED ORDER — HYDROMORPHONE HCL 1 MG/ML IJ SOLN
INTRAMUSCULAR | Status: AC
  Filled 2023-01-08: qty 1

## 2023-01-08 MED ORDER — CHLORHEXIDINE GLUCONATE 0.12 % MT SOLN: 15.0000 mL | Freq: Once | OROMUCOSAL | Status: AC

## 2023-01-08 MED ORDER — ACETAMINOPHEN 10 MG/ML IV SOLN
INTRAVENOUS | Status: AC
  Filled 2023-01-08: qty 100

## 2023-01-08 MED ORDER — HYDROMORPHONE HCL 1 MG/ML IJ SOLN
INTRAMUSCULAR | Status: DC | PRN
  Administered 2023-01-08: 1 mg via INTRAVENOUS

## 2023-01-08 MED ORDER — ACETAMINOPHEN 10 MG/ML IV SOLN: 1000.0000 mg | Freq: Once | INTRAVENOUS | Status: DC | PRN

## 2023-01-08 MED ORDER — BUPIVACAINE LIPOSOME 1.3 % IJ SUSP
INTRAMUSCULAR | Status: DC | PRN
  Administered 2023-01-08: 10 mL

## 2023-01-08 MED ORDER — DEXAMETHASONE SODIUM PHOSPHATE 10 MG/ML IJ SOLN
INTRAMUSCULAR | Status: DC | PRN
  Administered 2023-01-08: 10 mg via INTRAVENOUS

## 2023-01-08 MED ORDER — FENTANYL CITRATE PF 50 MCG/ML IJ SOSY
PREFILLED_SYRINGE | INTRAMUSCULAR | Status: AC
  Administered 2023-01-08: 50 ug
  Filled 2023-01-08: qty 1

## 2023-01-08 MED ORDER — LACTATED RINGERS IV SOLN: INTRAVENOUS | Status: DC

## 2023-01-08 MED ORDER — DROPERIDOL 2.5 MG/ML IJ SOLN
INTRAMUSCULAR | Status: AC
  Administered 2023-01-08: 0.625 mg via INTRAVENOUS
  Filled 2023-01-08: qty 2

## 2023-01-08 MED ORDER — OXYCODONE HCL 5 MG PO TABS: 5.0000 mg | ORAL_TABLET | ORAL | 0 refills | Status: AC | PRN

## 2023-01-08 MED ORDER — MIDAZOLAM HCL 2 MG/2ML IJ SOLN: 1.0000 mg | Freq: Once | INTRAMUSCULAR | Status: AC

## 2023-01-08 MED ORDER — FAMOTIDINE 20 MG PO TABS
ORAL_TABLET | ORAL | Status: AC
  Administered 2023-01-08: 20 mg via ORAL
  Filled 2023-01-08: qty 1

## 2023-01-08 MED ORDER — MIDAZOLAM HCL 2 MG/2ML IJ SOLN
INTRAMUSCULAR | Status: AC
  Administered 2023-01-08: 2 mg via INTRAVENOUS
  Filled 2023-01-08: qty 2

## 2023-01-08 MED ORDER — CEFAZOLIN SODIUM-DEXTROSE 2-4 GM/100ML-% IV SOLN
INTRAVENOUS | Status: AC
  Filled 2023-01-08: qty 100

## 2023-01-08 MED ORDER — DROPERIDOL 2.5 MG/ML IJ SOLN: 0.6250 mg | Freq: Once | INTRAMUSCULAR | Status: AC

## 2023-01-08 MED ORDER — ONDANSETRON HCL 4 MG/2ML IJ SOLN
4.0000 mg | Freq: Once | INTRAMUSCULAR | Status: AC | PRN
  Administered 2023-01-08: 4 mg via INTRAVENOUS

## 2023-01-08 MED ORDER — VANCOMYCIN HCL 1000 MG IV SOLR
INTRAVENOUS | Status: AC
  Filled 2023-01-08: qty 20

## 2023-01-08 MED ORDER — CHLORHEXIDINE GLUCONATE 0.12 % MT SOLN
OROMUCOSAL | Status: AC
  Administered 2023-01-08: 15 mL via OROMUCOSAL
  Filled 2023-01-08: qty 15

## 2023-01-08 SURGICAL SUPPLY — 83 items
ADAPTER IRRIG TUBE 2 SPIKE SOL (ADAPTER) ×2 IMPLANT
ADPR TBG 2 SPK PMP STRL ASCP (ADAPTER) ×2
ANCH SUT 2X2.3 2 STRN TPE (Anchor) ×4 IMPLANT
ANCH SUT 5 FBRTK 2.6 KNTLS SLF (Anchor) ×1 IMPLANT
ANCH SUT FBRTAPE 1.3X2.6X1.7 (Anchor) ×1 IMPLANT
ANCHOR ICONIX SPEED 2.0 TAPE (Anchor) IMPLANT
ANCHOR SUT FBRTK 2.6 SOFT 1.7 (Anchor) IMPLANT
ANCHOR SUT FBRTK 2.6 SP #5 (Anchor) IMPLANT
ANCHOR SWIVELOCK SP KL 4.75 (Anchor) IMPLANT
APL PRP STRL LF DISP 70% ISPRP (MISCELLANEOUS) ×1
BLADE SHAVER 4.5X7 STR FR (MISCELLANEOUS) ×1 IMPLANT
BRUSH SCRUB EZ  4% CHG (MISCELLANEOUS) ×1
BRUSH SCRUB EZ 4% CHG (MISCELLANEOUS) ×1 IMPLANT
BUR BR 5.5 WIDE MOUTH (BURR) IMPLANT
CANNULA PART THRD DISP 5.75X7 (CANNULA) IMPLANT
CANNULA PARTIAL THREAD 2X7 (CANNULA) ×1 IMPLANT
CANNULA TWIST IN 8.25X7CM (CANNULA) IMPLANT
CANNULA TWIST IN 8.25X9CM (CANNULA) IMPLANT
CHLORAPREP W/TINT 26 (MISCELLANEOUS) ×1 IMPLANT
COOLER POLAR GLACIER W/PUMP (MISCELLANEOUS) ×1 IMPLANT
DEVICE SUCT BLK HOLE OR FLOOR (MISCELLANEOUS) ×2 IMPLANT
DRAPE 3/4 80X56 (DRAPES) ×1 IMPLANT
DRAPE INCISE IOBAN 66X45 STRL (DRAPES) ×1 IMPLANT
DRAPE STERI 35X30 U-POUCH (DRAPES) ×1 IMPLANT
DRAPE U-SHAPE 47X51 STRL (DRAPES) ×2 IMPLANT
DRSG TEGADERM 4X4.75 (GAUZE/BANDAGES/DRESSINGS) ×2 IMPLANT
ELECT REM PT RETURN 9FT ADLT (ELECTROSURGICAL)
ELECTRODE REM PT RTRN 9FT ADLT (ELECTROSURGICAL) IMPLANT
GAUZE SPONGE 4X4 12PLY STRL (GAUZE/BANDAGES/DRESSINGS) ×1 IMPLANT
GAUZE XEROFORM 1X8 LF (GAUZE/BANDAGES/DRESSINGS) ×1 IMPLANT
GLOVE BIO SURGEON STRL SZ7.5 (GLOVE) ×1 IMPLANT
GLOVE BIOGEL PI IND STRL 8 (GLOVE) ×2 IMPLANT
GLOVE SURG ORTHO 8.0 STRL STRW (GLOVE) ×1 IMPLANT
GLOVE SURG SYN 7.5  E (GLOVE) ×1
GLOVE SURG SYN 7.5 E (GLOVE) ×1 IMPLANT
GLOVE SURG SYN 7.5 PF PI (GLOVE) ×1 IMPLANT
GOWN STRL REUS W/ TWL LRG LVL3 (GOWN DISPOSABLE) ×2 IMPLANT
GOWN STRL REUS W/TWL LRG LVL3 (GOWN DISPOSABLE) ×2
GOWN STRL REUS W/TWL LRG LVL4 (GOWN DISPOSABLE) ×1 IMPLANT
IV LACTATED RINGER IRRG 3000ML (IV SOLUTION) ×4
IV LR IRRIG 3000ML ARTHROMATIC (IV SOLUTION) ×4 IMPLANT
KIT STABILIZATION SHOULDER (MISCELLANEOUS) ×1 IMPLANT
KIT SUTURETAK 3.0 INSERT PERC (KITS) ×1 IMPLANT
KIT TURNOVER KIT A (KITS) ×1 IMPLANT
MANIFOLD NEPTUNE II (INSTRUMENTS) ×2 IMPLANT
MASK FACE SPIDER DISP (MASK) ×1 IMPLANT
MAT ABSORB  FLUID 56X50 GRAY (MISCELLANEOUS) ×2
MAT ABSORB FLUID 56X50 GRAY (MISCELLANEOUS) ×2 IMPLANT
NDL SAFETY ECLIP 18X1.5 (MISCELLANEOUS) ×1 IMPLANT
NEEDLE HYPO 22GX1.5 SAFETY (NEEDLE) ×1 IMPLANT
NS IRRIG 500ML POUR BTL (IV SOLUTION) ×1 IMPLANT
PACK ARTHROSCOPY SHOULDER (MISCELLANEOUS) ×1 IMPLANT
PAD ABD DERMACEA PRESS 5X9 (GAUZE/BANDAGES/DRESSINGS) ×1 IMPLANT
PAD ARMBOARD 7.5X6 YLW CONV (MISCELLANEOUS) ×1 IMPLANT
PAD WRAPON POLAR SHDR XLG (MISCELLANEOUS) ×1 IMPLANT
PASSER SUT FIRSTPASS SELF (INSTRUMENTS) ×1 IMPLANT
SHAVER BLADE BONE CUTTER  5.5 (BLADE)
SHAVER BLADE BONE CUTTER 5.5 (BLADE) IMPLANT
SLEEVE REMOTE CONTROL 5X12 (DRAPES) IMPLANT
SLING ULTRA II M (MISCELLANEOUS) ×1 IMPLANT
SPONGE T-LAP 18X18 ~~LOC~~+RFID (SPONGE) ×1 IMPLANT
STRAP SAFETY 5IN WIDE (MISCELLANEOUS) ×1 IMPLANT
SUT ETHILON 3-0 FS-10 30 BLK (SUTURE) ×1
SUT FIBERWIRE #2 38 BLUE 1/2 (SUTURE) ×1
SUT LASSO 90 DEG SD STR (SUTURE) ×1 IMPLANT
SUT MNCRL 4-0 (SUTURE) ×1
SUT MNCRL 4-0 27XMFL (SUTURE) ×1
SUT PDS AB 0 CT1 27 (SUTURE) IMPLANT
SUT VIC AB 0 CT1 36 (SUTURE) ×1 IMPLANT
SUT VIC AB 2-0 CT2 27 (SUTURE) ×1 IMPLANT
SUTURE EHLN 3-0 FS-10 30 BLK (SUTURE) ×1 IMPLANT
SUTURE FIBERWR #2 38 BLUE 1/2 (SUTURE) IMPLANT
SUTURE MNCRL 4-0 27XMF (SUTURE) ×1 IMPLANT
SYR 10ML LL (SYRINGE) ×1 IMPLANT
TAPE CLOTH 3X10 WHT NS LF (GAUZE/BANDAGES/DRESSINGS) ×1 IMPLANT
TRAP FLUID SMOKE EVACUATOR (MISCELLANEOUS) ×1 IMPLANT
TUBE SET DOUBLEFLO INFLOW (TUBING) ×1 IMPLANT
TUBING CONNECTING 10 (TUBING) ×1 IMPLANT
TUBING OUTFLOW SET DBLFO PUMP (TUBING) ×1 IMPLANT
WAND WEREWOLF FLOW 90D (MISCELLANEOUS) ×1 IMPLANT
WATER STERILE IRR 500ML POUR (IV SOLUTION) ×1 IMPLANT
WRAP SHOULDER HOT/COLD PACK (SOFTGOODS) ×1 IMPLANT
WRAPON POLAR PAD SHDR XLG (MISCELLANEOUS) ×1

## 2023-01-08 NOTE — Discharge Instructions (Addendum)
Post-Op Instructions - Rotator Cuff Repair  1. Bracing: You will wear a shoulder immobilizer or sling for 6 weeks.   2. Driving: No driving for at least 3 weeks post-op. When driving, do not wear the immobilizer. Ideally, we recommend no driving for 6 weeks while sling is in place as one arm will be immobilized.   3. Activity: No active lifting for 2 months. Wrist, hand, and elbow motion only. Avoid lifting the upper arm away from the body except for hygiene. You are permitted to bend and straighten the elbow passively only (no active elbow motion). You may use your hand and wrist for typing, writing, and managing utensils (cutting food). Do not lift more than a coffee cup for 8 weeks.  When sleeping or resting, inclined positions (recliner chair or wedge pillow) and a pillow under the forearm for support may provide better comfort for up to 4 weeks.  Avoid long distance travel for 4 weeks.  Return to normal activities after rotator cuff repair repair normally takes 6 months on average. If rehab goes very well, may be able to do most activities at 4 months, except overhead or contact sports.  4. Physical Therapy: Begins 3-4 days after surgery, and proceed 1 time per week for the first 6 weeks, then 1-2 times per week from weeks 6-20 post-op.  5. Medications:  - You will be provided a prescription for narcotic pain medicine. After surgery, take 1-2 narcotic tablets every 4 hours if needed for severe pain.  - A prescription for anti-nausea medication will be provided in case the narcotic medicine causes nausea - take 1 tablet every 6 hours only if nauseated.   - Take tylenol 1000 mg (2 Extra Strength tablets or 3 regular strength) every 8 hours for pain.  May decrease or stop tylenol 5 days after surgery if you are having minimal pain. - Take ASA '325mg'$ /day x 2 weeks to help prevent DVTs/PEs (blood clots).  - DO NOT take ANY nonsteroidal anti-inflammatory pain medications (Advil, Motrin, Ibuprofen,  Aleve, Naproxen, or Naprosyn). These medicines can inhibit healing of your shoulder repair.    If you are taking prescription medication for anxiety, depression, insomnia, muscle spasm, chronic pain, or for attention deficit disorder, you are advised that you are at a higher risk of adverse effects with use of narcotics post-op, including narcotic addiction/dependence, depressed breathing, death. If you use non-prescribed substances: alcohol, marijuana, cocaine, heroin, methamphetamines, etc., you are at a higher risk of adverse effects with use of narcotics post-op, including narcotic addiction/dependence, depressed breathing, death. You are advised that taking > 50 morphine milligram equivalents (MME) of narcotic pain medication per day results in twice the risk of overdose or death. For your prescription provided: oxycodone 5 mg - taking more than 6 tablets per day would result in > 50 morphine milligram equivalents (MME) of narcotic pain medication. Be advised that we will prescribe narcotics short-term, for acute post-operative pain only - 3 weeks for major operations such as shoulder repair/reconstruction surgeries.     6. Post-Op Appointment:  Your first post-op appointment will be 10-14 days post-op.  7. Work or School: For most, but not all procedures, we advise staying out of work or school for at least 1 to 2 weeks in order to recover from the stress of surgery and to allow time for healing.   If you need a work or school note this can be provided.   8. Smoking: If you are a smoker, you need to  refrain from smoking in the postoperative period. The nicotine in cigarettes will inhibit healing of your shoulder repair and decrease the chance of successful repair. Similarly, nicotine containing products (gum, patches) should be avoided.   Post-operative Brace: Apply and remove the brace you received as you were instructed to at the time of fitting and as described in detail as the brace's  instructions for use indicate.  Wear the brace for the period of time prescribed by your physician.  The brace can be cleaned with soap and water and allowed to air dry only.  Should the brace result in increased pain, decreased feeling (numbness/tingling), increased swelling or an overall worsening of your medical condition, please contact your doctor immediately.  If an emergency situation occurs as a result of wearing the brace after normal business hours, please dial 911 and seek immediate medical attention.  Let your doctor know if you have any further questions about the brace issued to you. Refer to the shoulder sling instructions for use if you have any questions regarding the correct fit of your shoulder sling.  [place] for Troubleshooting: [phone]  Video that illustrates how to properly use a shoulder sling: "Instructions for Proper Use of an Orthopaedic Sling" ShoppingLesson.hu    [place] Abductor sling instructions and diagram: (Blue ball)  Youtube video: https://youtu.be/dpzfU0kGJPw  Please contact your surgeon's office if you have any questions about this shoulder immobilizer.        POLAR CARE INFORMATION  [email or web address]  How to use [place] Cold Therapy System?  YouTube   [email or web address]  OPERATING INSTRUCTIONS  Start the product With dry hands, connect the transformer to the electrical connection located on the top of the cooler. Next, plug the transformer into an appropriate electrical outlet. The unit will automatically start running at this point.  To stop the pump, disconnect electrical power.  Unplug to stop the product when not in use. Unplugging the Polar Care unit turns it off. Always unplug immediately after use. Never leave it plugged in while unattended. Remove pad.    FIRST ADD WATER TO FILL LINE, THEN ICE---Replace ice when existing ice is almost melted  1 Discuss  Treatment with your [place] Practitioner and Use Only as Prescribed 2 Apply Insulation Barrier & Cold Therapy Pad 3 Check for Moisture 4 Inspect Skin Regularly  Tips and Trouble Shooting Usage Tips 1. Use cubed or chunked ice for optimal performance. 2. It is recommended to drain the Pad between uses. To drain the pad, hold the Pad upright with the hose pointed toward the ground. Depress the black plunger and allow water to drain out. 3. You may disconnect the Pad from the unit without removing the pad from the affected area by depressing the silver tabs on the hose coupling and gently pulling the hoses apart. The Pad and unit will seal itself and will not leak. Note: Some dripping during release is normal. 4. DO NOT RUN PUMP WITHOUT WATER! The pump in this unit is designed to run with water. Running the unit without water will cause permanent damage to the pump. 5. Unplug unit before removing lid.  TROUBLESHOOTING GUIDE Pump not running, Water not flowing to the pad, Pad is not getting cold 1. Make sure the transformer is plugged into the wall outlet. 2. Confirm that the ice and water are filled to the indicated levels. 3. Make sure there are no kinks in the pad. 4. Gently pull on the blue tube to make  sure the tube/pad junction is straight. 5. Remove the pad from the treatment site and ll it while the pad is lying at; then reapply. 6. Confirm that the pad couplings are securely attached to the unit. Listen for the double clicks (Figure 1) to confirm the pad couplings are securely attached.  Leaks    Note: Some condensation on the lines, controller, and pads is unavoidable, especially in warmer climates. 1. If using a [place] Polar Care Cold Therapy unit with a detachable Cold Therapy Pad, and a leak exists (other than condensation on the lines) disconnect the pad couplings. Make sure the silver tabs on the couplings are depressed before reconnecting the pad to the pump hose; then  confirm both sides of the coupling are properly clicked in. 2. If the coupling continues to leak or a leak is detected in the pad itself, stop using it and call [facility] at [phone].  Cleaning After use, empty and dry the unit with a soft cloth. Warm water and mild detergent may be used occasionally to clean the pump and tubes.  WARNING: The [place] can be cold enough to cause serious injury, including full skin necrosis. Follow these Operating Instructions, and carefully read the Product Insert (see pouch on side of unit) and the Cold Therapy Pad Fitting Instructions (provided with each Cold Therapy Pad) prior to use.       AMBULATORY SURGERY  DISCHARGE INSTRUCTIONS   The drugs that you were given will stay in your system until tomorrow so for the next 24 hours you should not:  Drive an automobile Make any legal decisions Drink any alcoholic beverage   You may resume regular meals tomorrow.  Today it is better to start with liquids and gradually work up to solid foods.  You may eat anything you prefer, but it is better to start with liquids, then soup and crackers, and gradually work up to solid foods.   Please notify your doctor immediately if you have any unusual bleeding, trouble breathing, redness and pain at the surgery site, drainage, fever, or pain not relieved by medication.    Additional Instructions:        Please contact your physician with any problems or Same Day Surgery at [phone], Monday through Friday 6 am to 4 pm, or [place] at [facility] number at [phone].

## 2023-01-08 NOTE — Anesthesia Procedure Notes (Signed)
Procedure Name: Intubation Date/Time: 01/08/2023 7:59 AM  Performed by: Patience Musca., CRNAPre-anesthesia Checklist: Patient identified, Patient being monitored, Timeout performed, Emergency Drugs available and Suction available Patient Re-evaluated:Patient Re-evaluated prior to induction Oxygen Delivery Method: Circle system utilized Preoxygenation: Pre-oxygenation with 100% oxygen Induction Type: IV induction Ventilation: Mask ventilation without difficulty Laryngoscope Size: 4 and McGraph Grade View: Grade I Tube type: Oral Tube size: 6.5 mm Number of attempts: 1 Airway Equipment and Method: Stylet Placement Confirmation: ETT inserted through vocal cords under direct vision, positive ETCO2 and breath sounds checked- equal and bilateral Secured at: 19 cm Tube secured with: Tape Dental Injury: Teeth and Oropharynx as per pre-operative assessment

## 2023-01-08 NOTE — Anesthesia Preprocedure Evaluation (Signed)
Anesthesia Evaluation  Patient identified by MRN, date of birth, ID band Patient awake    Reviewed: Allergy & Precautions, NPO status , Patient's Chart, lab work & pertinent test results  History of Anesthesia Complications Negative for: history of anesthetic complications  Airway Mallampati: II  TM Distance: >3 FB Neck ROM: Full    Dental no notable dental hx. (+) Teeth Intact   Pulmonary asthma , neg sleep apnea, neg COPD, Patient abstained from smoking.Not current smoker   Pulmonary exam normal breath sounds clear to auscultation       Cardiovascular Exercise Tolerance: Good METShypertension, Pt. on medications (-) CAD and (-) Past MI (-) dysrhythmias  Rhythm:Regular Rate:Normal - Systolic murmurs    Neuro/Psych negative neurological ROS  negative psych ROS   GI/Hepatic ,neg GERD  ,,(+)     (-) substance abuse    Endo/Other  neg diabetes    Renal/GU negative Renal ROS     Musculoskeletal   Abdominal  (+) + obese  Peds  Hematology   Anesthesia Other Findings Past Medical History: No date: Actinic keratosis No date: Asthma No date: Deaf, right 06/11/2012: Hx of squamous cell carcinoma     Comment:  Right medial post shoulder No date: Hypertension No date: RBBB (right bundle branch block) No date: Vertigo     Comment:  when "head is stopped up" No date: Wears contact lenses  Reproductive/Obstetrics                             Anesthesia Physical Anesthesia Plan  ASA: 2  Anesthesia Plan: General   Post-op Pain Management: Regional block* and Ofirmev IV (intra-op)*   Induction: Intravenous  PONV Risk Score and Plan: 3 and Ondansetron, Dexamethasone, Treatment may vary due to age or medical condition and Midazolam  Airway Management Planned: Oral ETT and Video Laryngoscope Planned  Additional Equipment: None  Intra-op Plan:   Post-operative Plan: Extubation in  OR  Informed Consent: I have reviewed the patients History and Physical, chart, labs and discussed the procedure including the risks, benefits and alternatives for the proposed anesthesia with the patient or authorized representative who has indicated his/her understanding and acceptance.     Dental advisory given  Plan Discussed with: CRNA and Surgeon  Anesthesia Plan Comments: (Discussed risks of anesthesia with patient, including PONV, sore throat, lip/dental/eye damage. Rare risks discussed as well, such as cardiorespiratory and neurological sequelae, and allergic reactions. Discussed the role of CRNA in patient's perioperative care. Patient understands. Discussed r/b/a of interscalene block, including elective nature. Risks discussed: - Rare: bleeding, infection, nerve damage - shortness of breath from hemidiaphragmatic paralysis - unilateral horner's syndrome - poor/non-working blocks - reactions and toxicity to local anesthetic Patient understands and agrees. )       Anesthesia Quick Evaluation

## 2023-01-08 NOTE — H&P (Signed)
Paper H&P to be scanned into permanent record. H&P reviewed. No significant changes noted.  

## 2023-01-08 NOTE — Anesthesia Postprocedure Evaluation (Signed)
Anesthesia Post Note  Patient: Allison Shelton  Procedure(s) Performed: right shoulder arthroscopic subscapularis repair, mini-open rotator cuff repair, distal clavicle excision, subacromial decompression (Right: Shoulder)  Patient location during evaluation: PACU Anesthesia Type: General Level of consciousness: awake and alert Pain management: pain level controlled Vital Signs Assessment: post-procedure vital signs reviewed and stable Respiratory status: spontaneous breathing, nonlabored ventilation, respiratory function stable and patient connected to nasal cannula oxygen Cardiovascular status: blood pressure returned to baseline and stable Postop Assessment: no apparent nausea or vomiting Anesthetic complications: no   There were no known notable events for this encounter.   Last Vitals:  Vitals:   01/08/23 1316 01/08/23 1413  BP: (!) 141/81 131/72  Pulse: 77 70  Resp:  20  Temp: (!) 36.2 C   SpO2: 92% 94%    Last Pain:  Vitals:   01/08/23 1413  TempSrc:   PainSc: 0-No pain                 Arita Miss

## 2023-01-08 NOTE — Transfer of Care (Signed)
Immediate Anesthesia Transfer of Care Note  Patient: Allison Shelton  Procedure(s) Performed: right shoulder arthroscopic subscapularis repair, mini-open rotator cuff repair, distal clavicle excision, subacromial decompression (Right: Shoulder)  Patient Location: PACU  Anesthesia Type:General  Level of Consciousness: drowsy and patient cooperative  Airway & Oxygen Therapy: Patient Spontanous Breathing and Patient connected to face mask oxygen  Post-op Assessment: Report given to RN and Post -op Vital signs reviewed and stable  Post vital signs: Reviewed and stable  Last Vitals:  Vitals Value Taken Time  BP 146/80 01/08/23 1220  Temp 36.4 C 01/08/23 1204  Pulse 71 01/08/23 1227  Resp 18 01/08/23 1227  SpO2 91 % 01/08/23 1227  Vitals shown include unvalidated device data.  Last Pain:  Vitals:   01/08/23 1220  TempSrc:   PainSc: 0-No pain      Patients Stated Pain Goal: 0 ([id number])  Complications: There were no known notable events for this encounter.

## 2023-01-08 NOTE — Op Note (Signed)
SURGERY DATE: 01/08/2023  PRE-OP DIAGNOSIS:  1. Right rotator cuff tear (subscapularis, supraspinatus, infraspinatus) 2. Right subacromial impingement 3. Right biceps tendinopathy 4. Right acromioclavicular joint osteoarthritis  POST-OP DIAGNOSIS: 1. Right rotator cuff tear (subscapularis, supraspinatus, infraspinatus) 2. Right subacromial impingement 3. Right proximal biceps rupture 4. Right acromioclavicular joint osteoarthritis  PROCEDURES:  1. Right arthroscopic rotator cuff repair (subscapularis) 2. Right mini-open rotator cuff repair (supraspinatus and infraspinatus) 3. Right arthroscopic distal clavicle excision 4. Right arthroscopic extensive debridement of shoulder (glenohumeral and subacromial spaces) 5. Right arthroscopic subacromial decompression  SURGEON: Cato Mulligan, MD  ASSISTANT: Anitra Lauth, PA  ANESTHESIA: Gen with Exparel interscalene block  ESTIMATED BLOOD LOSS: 25cc  DRAINS:  none  TOTAL IV FLUIDS: per anesthesia   SPECIMENS: none  IMPLANTS:  - Arthrex 2.81mm Knotless FiberTak x1 - Arthrex RC All Suture anchor x 1 - Arthrex 4.71mm SwiveLock x 4 - Iconix SPEED double loaded with 1.2 and 2.48mm tape x 4   OPERATIVE FINDINGS:  Examination under anesthesia: A careful examination under anesthesia was performed.  Passive range of motion was: FF: 150; ER at side: 45; ER in abduction: 90; IR in abduction: 45.  Anterior load shift: NT.  Posterior load shift: NT.  Sulcus in neutral: NT.  Sulcus in ER: NT.    Intra-operative findings: A thorough arthroscopic examination of the shoulder was performed.  The findings are: 1. Biceps tendon: Proximal substance not visualized within the joint.  Remnant stump from the superior labrum identified. 2. Superior labrum: injected with surrounding synovitis 3. Posterior labrum and capsule: normal 4. Inferior capsule and inferior recess: normal 5. Glenoid cartilage surface: Normal 6. Supraspinatus attachment:  full-thickness tear 7. Posterior rotator cuff attachment: Full-thickness tear of the anterior infraspinatus 8. Humeral head articular cartilage: normal 9. Rotator interval: significant synovitis 10: Subscapularis tendon: Full-thickness tear with retraction to the glenoid 11. Anterior labrum: degenerative 12. IGHL: normal  OPERATIVE REPORT:   Indications for procedure: KYMBERLEY VERBEEK is a 72 y.o. female with approximately 5 weeks of right shoulder pain after a trip and fall.  She has been unable to actively abduct or forward flex her arm overhead since that time.  Prior to this time, she had no significant pain in the shoulder. Clinical exam and MRI were suggestive of rotator cuff tear including subscapularis, supraspinatus, and infraspinatus tears; subacromial impingement; and acromioclavicular joint arthritis. Given these findings, we decided to proceed with surgical management. After discussion of risks, benefits, and alternatives to surgery, the patient elected to proceed.   Procedure in detail:  I identified AMBERLEIGH CARMANY in the pre-operative holding area.  I marked the operative shoulder with my initials. I reviewed the risks and benefits of the proposed surgical intervention, and the patient (and/or patient's guardian) wished to proceed.  Anesthesia was then performed with an Exparel interscalene block.  The patient was transferred to the operative suite and placed in the beach chair position.    SCDs were placed on the lower extremities. Appropriate IV antibiotics were administered prior to incision. The operative upper extremity was then prepped and draped in standard fashion. A time out was performed confirming the correct extremity, correct patient, and correct procedure.   I then created a standard posterior portal with an 11 blade. The glenohumeral joint was easily entered with a blunt trochar and the arthroscope introduced. The findings of diagnostic arthroscopy are described above.  A  standard anterior portal was made.  I debrided degenerative tissue including the synovitic tissue  about the rotator interval and IGHL as well as the anterior and superior labrum. I then coagulated the inflamed synovium to obtain hemostasis and reduce the risk of post-operative swelling using an Arthrocare radiofrequency device. The remnant stump of the biceps tendon on the superior labrum was also debrided with an oscillating shaver down to a stable base.  The subscapularis tear was identified.  A superior anterolateral portal was made under needle localization.  A cannula was placed.  The comma tissue indicating the superolateral border of the subscapularis was identified readily.  The tip of the coracoid as well as the conjoined tendon and coracoacromial ligaments were visualized after debriding rotator interval tissue.  Tissue about the subscapularis was released anteriorly, superiorly, and posteriorly to allow for improved mobilization.  The comma tissue was tagged with a FiberWire suture and tension on this allowed for appropriate mobilization of the subscapularis.  The lesser tuberosity footprint was prepared with a combination of electrocautery and an arthroscopic curette and rasp.  This was performed with the assistance of a 70 degree arthroscope.  Given the size of the tear, a double row repair was performed.  2 iconic speed anchors were placed (1 superiorly, and one inferiorly) just off the articular margin.  2 limbs of sutures from each anchor were passed through the subscapularis using a FirstPass suture passer.  Next, the inferior strand from each anchor was loaded onto an Arthrex 4.75 mm SwiveLock anchor.  This was impacted inferiorly into the bicipital groove while holding appropriate tension on the repair sutures.  Similarly the superior strands from each anchor were loaded onto another SwiveLock anchor.  This was impacted at the superior portion of the bicipital groove.  This appropriately reduced  the body of the subscapularis with excellent compression over the lesser tuberosity footprint.  The upper border was still noted to be incompletely reduced.  A knotless FiberTak anchor was placed and repair stitch was shuttled through the upper border.  Knotless mechanism was utilized and suture was tensioned appropriately until subscapularis was completely reduced. The arm was then internally and externally rotated and the subscapularis was noted to move appropriately with rotation and excellent compression.  Next, the arthroscope was then introduced into the subacromial space.  An extensive subacromial bursectomy was performed using a combination of the shaver and Arthrocare wand. The entire acromial undersurface was exposed and the CA ligament was subperiosteally elevated to expose the anterior acromial hook. A 5.43mm barrel burr was used to create a flat anterior and lateral aspect of the acromion, converting it from a Type 2 to a Type 1 acromion. Care was made to keep the deltoid fascia intact.  I then turned my attention to the arthroscopic distal clavicle excision. I identified the acromioclavicular joint. Surrounding bursal tissue was debrided and the edges of the joint were identified. I used the 5.2mm barrel burr to remove the distal clavicle parallel to the edge of the acromion. I was able to fit two widths of the burr into the space between the distal clavicle and acromion, signifying that I had removed ~58mm of distal clavicle. This was confirmed by viewing anteriorly and introducing a probe with measuring marks from the lateral portal. Hemostasis was achieved with an Arthrocare wand. Fluid was evacuated from the shoulder.  The supraspinatus and infraspinatus tear was visualized and decision was made to proceed with a mini-open approach.  A longitudinal incision from the anterolateral acromion ~6cm in length was made overlying the raphe between the anterior and middle heads  of the deltoid.  This  incision also incorporated the anterolateral portal.  The raphe was identified and it was incised. The subacromial space was identified. Any remaining bursa was excised. The rotator cuff tear involving the supraspinatus and part of the infraspinatus was identified. It was a U-shaped tear.  The rotator cuff footprint was cleared of soft tissue. The footprint was smoothed and a rongeur was used to create a smooth, bleeding bed. Two Iconix SPEED anchors were placed just lateral to the articular margin, 1 at the anterior aspect and one at the posterior aspect of the tear.  A third medial row anchor was utilized with an Arthrex RC all suture anchor.  The rotator cuff was held in a reduced position and all 12 limbs of suture were passed appropriately through the superior rotator cuff. Two SwiveLock anchors were placed for the lateral row anchors with one limb of each of the 6 medial row sutures passed through each anchor.  This allowed for excellent reapproximation and compression of the rotator cuff over its footprint.  There is no undue tension.  The construct was stable with external and internal rotation.  The wound was thoroughly irrigated.  The deltoid split was closed with 0 Vicryl.  The subdermal layer was closed with 2-0 Vicryl.  The skin was closed with 4-0 Monocryl and Dermabond. The portals were closed with 3-0 Nylon. Xeroform was applied to the incisions. A sterile dressing was applied, followed by a Polar Care sleeve and a SlingShot shoulder immobilizer/sling. The patient was awakened from anesthesia without difficulty and was transferred to the PACU in stable condition.   Of note, assistance from a PA was essential to performing the surgery. PA assisted with patient positioning, retraction, and instrumentation. The surgery would have been more difficult and had longer operative time without PA assistance.    COMPLICATIONS: none  DISPOSITION: plan for discharge home after recovery in  PACU   POSTOPERATIVE PLAN: Remain in sling (except hygiene and elbow/wrist/hand RoM exercises as instructed by PT) x 6 weeks and NWB for this time. PT to begin 3-4 days after surgery.  Use large rotator cuff repair rehab protocol with subscapularis repair.  ASA 325mg  daily x 2 weeks for DVT ppx.

## 2023-01-08 NOTE — Anesthesia Procedure Notes (Signed)
Anesthesia Regional Block: Interscalene brachial plexus block   Pre-Anesthetic Checklist: , timeout performed,  Correct Patient, Correct Site, Correct Laterality,  Correct Procedure, Correct Position, site marked,  Risks and benefits discussed,  Surgical consent,  Pre-op evaluation,  At surgeon's request and post-op pain management  Laterality: Right  Prep: chloraprep       Needles:  Injection technique: Single-shot  Needle Type: Echogenic Needle     Needle Length: 4cm  Needle Gauge: 25     Additional Needles:   Procedures:,,,, ultrasound used (permanent image in chart),,    Narrative:  Start time: 01/08/2023 7:30 AM End time: 01/08/2023 7:35 AM Injection made incrementally with aspirations every 5 mL.  Performed by: Personally  Anesthesiologist: Dimas Millin, MD  Additional Notes: Patient's chart reviewed and they were deemed appropriate candidate for procedure, at surgeon's request. Patient educated about risks, benefits, and alternatives of the block including but not limited to: temporary or permanent nerve damage, bleeding, infection, damage to surround tissues, pneumothorax, hemidiaphragmatic paralysis, unilateral Horner's syndrome, block failure, local anesthetic toxicity. Patient expressed understanding. A formal time-out was conducted consistent with institution rules.  Monitors were applied, and minimal sedation used (see nursing record). The site was prepped with skin prep and allowed to dry, and sterile gloves were used. A high frequency linear ultrasound probe with probe cover was utilized throughout. C5-7 nerve roots located and appeared anatomically normal, local anesthetic injected around them, and echogenic block needle trajectory was monitored throughout. Aspiration performed every 79ml. Lung and blood vessels were avoided. All injections were performed without resistance and free of blood and paresthesias. The patient tolerated the procedure well.  Injectate:  15ml exparel + 50ml 0.5% bupivacaine

## 2023-01-11 DIAGNOSIS — M25511 Pain in right shoulder: Secondary | ICD-10-CM | POA: Diagnosis not present

## 2023-01-11 DIAGNOSIS — Z9889 Other specified postprocedural states: Secondary | ICD-10-CM | POA: Diagnosis not present

## 2023-02-05 DIAGNOSIS — M25511 Pain in right shoulder: Secondary | ICD-10-CM | POA: Diagnosis not present

## 2023-02-05 DIAGNOSIS — Z9889 Other specified postprocedural states: Secondary | ICD-10-CM | POA: Diagnosis not present

## 2023-02-13 DIAGNOSIS — M25511 Pain in right shoulder: Secondary | ICD-10-CM | POA: Diagnosis not present

## 2023-02-13 DIAGNOSIS — Z9889 Other specified postprocedural states: Secondary | ICD-10-CM | POA: Diagnosis not present

## 2023-02-20 DIAGNOSIS — M25511 Pain in right shoulder: Secondary | ICD-10-CM | POA: Diagnosis not present

## 2023-02-20 DIAGNOSIS — Z9889 Other specified postprocedural states: Secondary | ICD-10-CM | POA: Diagnosis not present

## 2023-02-26 DIAGNOSIS — Z9889 Other specified postprocedural states: Secondary | ICD-10-CM | POA: Diagnosis not present

## 2023-02-28 ENCOUNTER — Ambulatory Visit: Payer: PPO | Admitting: Dermatology

## 2023-02-28 DIAGNOSIS — Z9889 Other specified postprocedural states: Secondary | ICD-10-CM | POA: Diagnosis not present

## 2023-02-28 DIAGNOSIS — M25511 Pain in right shoulder: Secondary | ICD-10-CM | POA: Diagnosis not present

## 2023-03-05 DIAGNOSIS — M25511 Pain in right shoulder: Secondary | ICD-10-CM | POA: Diagnosis not present

## 2023-03-05 DIAGNOSIS — Z9889 Other specified postprocedural states: Secondary | ICD-10-CM | POA: Diagnosis not present

## 2023-03-07 DIAGNOSIS — Z9889 Other specified postprocedural states: Secondary | ICD-10-CM | POA: Diagnosis not present

## 2023-03-14 DIAGNOSIS — Z9889 Other specified postprocedural states: Secondary | ICD-10-CM | POA: Diagnosis not present

## 2023-03-14 DIAGNOSIS — M25511 Pain in right shoulder: Secondary | ICD-10-CM | POA: Diagnosis not present

## 2023-03-19 DIAGNOSIS — Z9889 Other specified postprocedural states: Secondary | ICD-10-CM | POA: Diagnosis not present

## 2023-03-21 DIAGNOSIS — M25511 Pain in right shoulder: Secondary | ICD-10-CM | POA: Diagnosis not present

## 2023-03-21 DIAGNOSIS — Z9889 Other specified postprocedural states: Secondary | ICD-10-CM | POA: Diagnosis not present

## 2023-03-27 DIAGNOSIS — M25511 Pain in right shoulder: Secondary | ICD-10-CM | POA: Diagnosis not present

## 2023-03-27 DIAGNOSIS — Z9889 Other specified postprocedural states: Secondary | ICD-10-CM | POA: Diagnosis not present

## 2023-03-28 DIAGNOSIS — E782 Mixed hyperlipidemia: Secondary | ICD-10-CM | POA: Diagnosis not present

## 2023-03-28 DIAGNOSIS — E538 Deficiency of other specified B group vitamins: Secondary | ICD-10-CM | POA: Diagnosis not present

## 2023-03-28 DIAGNOSIS — R739 Hyperglycemia, unspecified: Secondary | ICD-10-CM | POA: Diagnosis not present

## 2023-03-29 DIAGNOSIS — M25511 Pain in right shoulder: Secondary | ICD-10-CM | POA: Diagnosis not present

## 2023-03-29 DIAGNOSIS — Z9889 Other specified postprocedural states: Secondary | ICD-10-CM | POA: Diagnosis not present

## 2023-04-02 DIAGNOSIS — M25511 Pain in right shoulder: Secondary | ICD-10-CM | POA: Diagnosis not present

## 2023-04-02 DIAGNOSIS — Z9889 Other specified postprocedural states: Secondary | ICD-10-CM | POA: Diagnosis not present

## 2023-04-04 DIAGNOSIS — Z0000 Encounter for general adult medical examination without abnormal findings: Secondary | ICD-10-CM | POA: Diagnosis not present

## 2023-04-04 DIAGNOSIS — E782 Mixed hyperlipidemia: Secondary | ICD-10-CM | POA: Diagnosis not present

## 2023-04-04 DIAGNOSIS — I10 Essential (primary) hypertension: Secondary | ICD-10-CM | POA: Diagnosis not present

## 2023-04-04 DIAGNOSIS — R739 Hyperglycemia, unspecified: Secondary | ICD-10-CM | POA: Diagnosis not present

## 2023-04-04 DIAGNOSIS — E538 Deficiency of other specified B group vitamins: Secondary | ICD-10-CM | POA: Diagnosis not present

## 2023-04-04 DIAGNOSIS — D369 Benign neoplasm, unspecified site: Secondary | ICD-10-CM | POA: Diagnosis not present

## 2023-04-04 DIAGNOSIS — M25511 Pain in right shoulder: Secondary | ICD-10-CM | POA: Diagnosis not present

## 2023-04-08 DIAGNOSIS — M25511 Pain in right shoulder: Secondary | ICD-10-CM | POA: Diagnosis not present

## 2023-04-08 DIAGNOSIS — Z9889 Other specified postprocedural states: Secondary | ICD-10-CM | POA: Diagnosis not present

## 2023-04-11 DIAGNOSIS — M25511 Pain in right shoulder: Secondary | ICD-10-CM | POA: Diagnosis not present

## 2023-04-11 DIAGNOSIS — Z9889 Other specified postprocedural states: Secondary | ICD-10-CM | POA: Diagnosis not present

## 2023-04-16 DIAGNOSIS — M25511 Pain in right shoulder: Secondary | ICD-10-CM | POA: Diagnosis not present

## 2023-04-16 DIAGNOSIS — Z9889 Other specified postprocedural states: Secondary | ICD-10-CM | POA: Diagnosis not present

## 2023-04-18 DIAGNOSIS — M25511 Pain in right shoulder: Secondary | ICD-10-CM | POA: Diagnosis not present

## 2023-04-18 DIAGNOSIS — Z9889 Other specified postprocedural states: Secondary | ICD-10-CM | POA: Diagnosis not present

## 2023-04-22 DIAGNOSIS — Z9889 Other specified postprocedural states: Secondary | ICD-10-CM | POA: Diagnosis not present

## 2023-04-22 DIAGNOSIS — M25511 Pain in right shoulder: Secondary | ICD-10-CM | POA: Diagnosis not present

## 2023-04-24 DIAGNOSIS — Z9889 Other specified postprocedural states: Secondary | ICD-10-CM | POA: Diagnosis not present

## 2023-04-24 DIAGNOSIS — S46011A Strain of muscle(s) and tendon(s) of the rotator cuff of right shoulder, initial encounter: Secondary | ICD-10-CM | POA: Diagnosis not present

## 2023-04-24 DIAGNOSIS — M25511 Pain in right shoulder: Secondary | ICD-10-CM | POA: Diagnosis not present

## 2023-04-30 DIAGNOSIS — Z9889 Other specified postprocedural states: Secondary | ICD-10-CM | POA: Diagnosis not present

## 2023-04-30 DIAGNOSIS — M25511 Pain in right shoulder: Secondary | ICD-10-CM | POA: Diagnosis not present

## 2023-05-02 DIAGNOSIS — Z9889 Other specified postprocedural states: Secondary | ICD-10-CM | POA: Diagnosis not present

## 2023-05-02 DIAGNOSIS — M25511 Pain in right shoulder: Secondary | ICD-10-CM | POA: Diagnosis not present

## 2023-05-07 DIAGNOSIS — Z9889 Other specified postprocedural states: Secondary | ICD-10-CM | POA: Diagnosis not present

## 2023-05-07 DIAGNOSIS — M25511 Pain in right shoulder: Secondary | ICD-10-CM | POA: Diagnosis not present

## 2023-05-09 DIAGNOSIS — Z9889 Other specified postprocedural states: Secondary | ICD-10-CM | POA: Diagnosis not present

## 2023-05-09 DIAGNOSIS — M25511 Pain in right shoulder: Secondary | ICD-10-CM | POA: Diagnosis not present

## 2023-05-14 DIAGNOSIS — M25511 Pain in right shoulder: Secondary | ICD-10-CM | POA: Diagnosis not present

## 2023-05-14 DIAGNOSIS — Z9889 Other specified postprocedural states: Secondary | ICD-10-CM | POA: Diagnosis not present

## 2023-05-16 DIAGNOSIS — M25511 Pain in right shoulder: Secondary | ICD-10-CM | POA: Diagnosis not present

## 2023-05-16 DIAGNOSIS — Z9889 Other specified postprocedural states: Secondary | ICD-10-CM | POA: Diagnosis not present

## 2023-05-21 DIAGNOSIS — Z9889 Other specified postprocedural states: Secondary | ICD-10-CM | POA: Diagnosis not present

## 2023-05-21 DIAGNOSIS — M25511 Pain in right shoulder: Secondary | ICD-10-CM | POA: Diagnosis not present

## 2023-05-23 DIAGNOSIS — Z9889 Other specified postprocedural states: Secondary | ICD-10-CM | POA: Diagnosis not present

## 2023-05-23 DIAGNOSIS — M25511 Pain in right shoulder: Secondary | ICD-10-CM | POA: Diagnosis not present

## 2023-05-28 DIAGNOSIS — Z9889 Other specified postprocedural states: Secondary | ICD-10-CM | POA: Diagnosis not present

## 2023-05-28 DIAGNOSIS — M25511 Pain in right shoulder: Secondary | ICD-10-CM | POA: Diagnosis not present

## 2023-05-29 ENCOUNTER — Ambulatory Visit: Payer: PPO | Admitting: Dermatology

## 2023-05-29 DIAGNOSIS — L821 Other seborrheic keratosis: Secondary | ICD-10-CM

## 2023-05-29 DIAGNOSIS — Z8589 Personal history of malignant neoplasm of other organs and systems: Secondary | ICD-10-CM

## 2023-05-29 DIAGNOSIS — L814 Other melanin hyperpigmentation: Secondary | ICD-10-CM

## 2023-05-29 DIAGNOSIS — W908XXA Exposure to other nonionizing radiation, initial encounter: Secondary | ICD-10-CM | POA: Diagnosis not present

## 2023-05-29 DIAGNOSIS — Z85828 Personal history of other malignant neoplasm of skin: Secondary | ICD-10-CM

## 2023-05-29 DIAGNOSIS — D1801 Hemangioma of skin and subcutaneous tissue: Secondary | ICD-10-CM

## 2023-05-29 DIAGNOSIS — L820 Inflamed seborrheic keratosis: Secondary | ICD-10-CM | POA: Diagnosis not present

## 2023-05-29 DIAGNOSIS — L578 Other skin changes due to chronic exposure to nonionizing radiation: Secondary | ICD-10-CM

## 2023-05-29 DIAGNOSIS — Z1283 Encounter for screening for malignant neoplasm of skin: Secondary | ICD-10-CM | POA: Diagnosis not present

## 2023-05-29 DIAGNOSIS — Z872 Personal history of diseases of the skin and subcutaneous tissue: Secondary | ICD-10-CM

## 2023-05-29 DIAGNOSIS — D229 Melanocytic nevi, unspecified: Secondary | ICD-10-CM

## 2023-05-29 NOTE — Progress Notes (Signed)
Follow-Up Visit   Subjective  Allison Shelton is a 72 y.o. female who presents for the following: Skin Cancer Screening and Full Body Skin Exam hx of scc, hx of isks   The patient presents for Total-Body Skin Exam (TBSE) for skin cancer screening and mole check. The patient has spots, moles and lesions to be evaluated, some may be new or changing and the patient may have concern these could be cancer.    The following portions of the chart were reviewed this encounter and updated as appropriate: medications, allergies, medical history  Review of Systems:  No other skin or systemic complaints except as noted in HPI or Assessment and Plan.  Objective  Well appearing patient in no apparent distress; mood and affect are within normal limits.  A full examination was performed including scalp, head, eyes, ears, nose, lips, neck, chest, axillae, abdomen, back, buttocks, bilateral upper extremities, bilateral lower extremities, hands, feet, fingers, toes, fingernails, and toenails. All findings within normal limits unless otherwise noted below.   Relevant physical exam findings are noted in the Assessment and Plan.  right neck and face x 3 (3) Erythematous stuck-on, waxy papule or plaque    Assessment & Plan   SKIN CANCER SCREENING PERFORMED TODAY.  ACTINIC DAMAGE - Chronic condition, secondary to cumulative UV/sun exposure - diffuse scaly erythematous macules with underlying dyspigmentation - Recommend daily broad spectrum sunscreen SPF 30+ to sun-exposed areas, reapply every 2 hours as needed.  - Staying in the shade or wearing long sleeves, sun glasses (UVA+UVB protection) and wide brim hats (4-inch brim around the entire circumference of the hat) are also recommended for sun protection.  - Call for new or changing lesions.  LENTIGINES, SEBORRHEIC KERATOSES, HEMANGIOMAS - Benign normal skin lesions - Benign-appearing - Call for any changes  Acrochordons (Skin Tags) - Fleshy,  skin-colored pedunculated papules - Benign appearing.  - Observe. - If desired, they can be removed with an in office procedure that is not covered by insurance. - Please call the clinic if you notice any new or changing lesions.    MELANOCYTIC NEVI - Tan-brown and/or pink-flesh-colored symmetric macules and papules - Benign appearing on exam today - Observation - Call clinic for new or changing moles - Recommend daily use of broad spectrum spf 30+ sunscreen to sun-exposed areas.   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  - Call if any new or changing lesions are noted between office visits   Inflamed seborrheic keratosis (3) right neck and face x 3  Symptomatic, irritating, patient would like treated.  Destruction of lesion - right neck and face x 3 (3) Complexity: simple   Destruction method: cryotherapy   Informed consent: discussed and consent obtained   Timeout:  patient name, date of birth, surgical site, and procedure verified Lesion destroyed using liquid nitrogen: Yes   Region frozen until ice ball extended beyond lesion: Yes   Outcome: patient tolerated procedure well with no complications   Post-procedure details: wound care instructions given       Return in about 1 year (around 05/28/2024) for TBSE.  Geralynn Rile, CMA, am acting as scribe for Armida Sans, MD.   Documentation: I have reviewed the above documentation for accuracy and completeness, and  I agree with the above.  Armida Sans, MD

## 2023-05-29 NOTE — Patient Instructions (Addendum)
Seborrheic Keratosis      What causes seborrheic keratoses? Seborrheic keratoses are harmless, common skin growths that first appear during adult life.  As time goes by, more growths appear.  Some people may develop a large number of them.  Seborrheic keratoses appear on both covered and uncovered body parts.  They are not caused by sunlight.  The tendency to develop seborrheic keratoses can be inherited.  They vary in color from skin-colored to gray, brown, or even black.  They can be either smooth or have a rough, warty surface.   Seborrheic keratoses are superficial and look as if they were stuck on the skin.  Under the microscope this type of keratosis looks like layers upon layers of skin.  That is why at times the top layer may seem to fall off, but the rest of the growth remains and re-grows.    Treatment Seborrheic keratoses do not need to be treated, but can easily be removed in the office.  Seborrheic keratoses often cause symptoms when they rub on clothing or jewelry.  Lesions can be in the way of shaving.  If they become inflamed, they can cause itching, soreness, or burning.  Removal of a seborrheic keratosis can be accomplished by freezing, burning, or surgery. If any spot bleeds, scabs, or grows rapidly, please return to have it checked, as these can be an indication of a skin cancer.   Cryotherapy Aftercare  Wash gently with soap and water everyday.   Apply Vaseline and Band-Aid daily until healed.      Melanoma ABCDEs    Melanoma is the most dangerous type of skin cancer, and is the leading cause of death from skin disease.  You are more likely to develop melanoma if you: Have light-colored skin, light-colored eyes, or red or blond hair Spend a lot of time in the sun Tan regularly, either outdoors or in a tanning bed Have had blistering sunburns, especially during childhood Have a close family member who has had a melanoma Have atypical moles or large  birthmarks  Early detection of melanoma is key since treatment is typically straightforward and cure rates are extremely high if we catch it early.   The first sign of melanoma is often a change in a mole or a new dark spot.  The ABCDE system is a way of remembering the signs of melanoma.  A for asymmetry:  The two halves do not match. B for border:  The edges of the growth are irregular. C for color:  A mixture of colors are present instead of an even brown color. D for diameter:  Melanomas are usually (but not always) greater than 6mm - the size of a pencil eraser. E for evolution:  The spot keeps changing in size, shape, and color.  Please check your skin once per month between visits. You can use a small mirror in front and a large mirror behind you to keep an eye on the back side or your body.   If you see any new or changing lesions before your next follow-up, please call to schedule a visit.  Please continue daily skin protection including broad spectrum sunscreen SPF 30+ to sun-exposed areas, reapplying every 2 hours as needed when you're outdoors.   Staying in the shade or wearing long sleeves, sun glasses (UVA+UVB protection) and wide brim hats (4-inch brim around the entire circumference of the hat) are also recommended for sun protection.    Due to recent changes in healthcare laws,  you may see results of your pathology and/or laboratory studies on MyChart before the doctors have had a chance to review them. We understand that in some cases there may be results that are confusing or concerning to you. Please understand that not all results are received at the same time and often the doctors may need to interpret multiple results in order to provide you with the best plan of care or course of treatment. Therefore, we ask that you please give [place] 2 business days to thoroughly review all your results before contacting the office for clarification. Should we see a critical lab result, you  will be contacted sooner.   If You Need Anything After Your Visit  If you have any questions or concerns for your doctor, please call our main line at [phone] and press option 4 to reach your doctor's medical assistant. If no one answers, please leave a voicemail as directed and we will return your call as soon as possible. Messages left after 4 pm will be answered the following business day.   You may also send [place] a message via MyChart. We typically respond to MyChart messages within 1-2 business days.  For prescription refills, please ask your pharmacy to contact our office. Our fax number is [phone].  If you have an urgent issue when the clinic is closed that cannot wait until the next business day, you can page your doctor at the number below.    Please note that while we do our best to be available for urgent issues outside of office hours, we are not available 24/7.   If you have an urgent issue and are unable to reach [place], you may choose to seek medical care at your doctor's office, retail clinic, urgent care center, or emergency room.  If you have a medical emergency, please immediately call 911 or go to the emergency department.  Pager Numbers  - Dr. Gwen Pounds: [phone]  - Dr. Roseanne Reno: [phone]  In the event of inclement weather, please call our main line at [phone] for an update on the status of any delays or closures.  Dermatology Medication Tips: Please keep the boxes that topical medications come in in order to help keep track of the instructions about where and how to use these. Pharmacies typically print the medication instructions only on the boxes and not directly on the medication tubes.   If your medication is too expensive, please contact our office at [phone] option 4 or send [place] a message through MyChart.   We are unable to tell what your co-pay for medications will be in advance as this is different depending on your insurance coverage.  However, we may be able to find a substitute medication at lower cost or fill out paperwork to get insurance to cover a needed medication.   If a prior authorization is required to get your medication covered by your insurance company, please allow [place] 1-2 business days to complete this process.  Drug prices often vary depending on where the prescription is filled and some pharmacies may offer cheaper prices.  The website www.goodrx.com contains coupons for medications through different pharmacies. The prices here do not account for what the cost may be with help from insurance (it may be cheaper with your insurance), but the website can give you the price if you did not use any insurance.  - You can print the associated coupon and take it with your prescription to the pharmacy.  - You may also stop  by our office during regular business hours and pick up a GoodRx coupon card.  - If you need your prescription sent electronically to a different pharmacy, notify our office through [facility] or by phone at [phone] option 4.     Si Usted Necesita Algo Despus de Su Visita  Tambin puede enviarnos un mensaje a travs de Clinical cytogeneticist. Por lo general respondemos a los mensajes de MyChart en el transcurso de 1 a 2 das hbiles.  Para renovar recetas, por favor pida a su farmacia que se ponga en contacto con nuestra oficina. Annie Sable de fax es [place] [phone].  Si tiene un asunto urgente cuando la clnica est cerrada y que no puede esperar hasta el siguiente da hbil, puede llamar/localizar a su doctor(a) al nmero que aparece a continuacin.   Por favor, tenga en cuenta que aunque hacemos todo lo posible para estar disponibles para asuntos urgentes fuera del horario de [place], no estamos disponibles las 24 horas del da, los 7 [address]  [address] de la [place].   Si tiene un problema urgente y no puede comunicarse con nosotros, puede optar por buscar atencin mdica  en el consultorio de su  doctor(a), en una clnica privada, en un centro de atencin urgente o en una sala de emergencias.  Si tiene Engineer, drilling, por favor llame inmediatamente al 911 o vaya a la sala de emergencias.  Nmeros de bper  - Dr. Gwen Pounds: [phone]  - Dra. Roseanne Reno: [phone]  En caso de inclemencias del [place], por favor llame a Lacy Duverney principal al [phone] para una actualizacin sobre el [place] de cualquier retraso o cierre.  Consejos para la medicacin en dermatologa: Por favor, guarde las cajas en las que vienen los medicamentos de uso tpico para ayudarle a seguir las instrucciones sobre dnde y cmo usarlos. Las farmacias generalmente imprimen las instrucciones del medicamento slo en las cajas y no directamente en los tubos del [place].   Si su medicamento es muy caro, por favor, pngase en contacto con Rolm Gala llamando al [phone] y presione la opcin 4 o envenos un mensaje a travs de Clinical cytogeneticist.   No podemos decirle cul ser su copago por los medicamentos por adelantado ya que esto es diferente dependiendo de la cobertura de su seguro. Sin embargo, es posible que podamos encontrar un medicamento sustituto a Audiological scientist un formulario para que el seguro cubra el medicamento que se considera necesario.   Si se requiere una autorizacin previa para que su compaa de seguros [place] su medicamento, por favor permtanos de 1 a 2 das hbiles para completar [address]  Los precios de los medicamentos varan con frecuencia dependiendo del Environmental consultant de dnde se surte la receta y alguna farmacias pueden ofrecer precios ms baratos.  El sitio web www.goodrx.com tiene cupones para medicamentos de Health and safety inspector. Los precios aqu no tienen en cuenta lo que podra costar con la ayuda del seguro (puede ser ms barato con su seguro), pero el sitio web puede darle el precio si no utiliz Tourist information centre manager.  - Puede imprimir el cupn correspondiente y llevarlo  con su receta a la farmacia.  - Tambin puede pasar por nuestra oficina durante el horario de atencin regular y Education officer, museum una tarjeta de cupones de GoodRx.  - Si necesita que su receta se enve electrnicamente a una farmacia diferente, informe a nuestra oficina a travs de MyChart de [place] o por telfono llamando al [phone] y presione la opcin 4.

## 2023-05-30 DIAGNOSIS — Z9889 Other specified postprocedural states: Secondary | ICD-10-CM | POA: Diagnosis not present

## 2023-05-30 DIAGNOSIS — M25511 Pain in right shoulder: Secondary | ICD-10-CM | POA: Diagnosis not present

## 2023-05-30 DIAGNOSIS — H2513 Age-related nuclear cataract, bilateral: Secondary | ICD-10-CM | POA: Diagnosis not present

## 2023-05-30 DIAGNOSIS — H43813 Vitreous degeneration, bilateral: Secondary | ICD-10-CM | POA: Diagnosis not present

## 2023-06-04 DIAGNOSIS — Z9889 Other specified postprocedural states: Secondary | ICD-10-CM | POA: Diagnosis not present

## 2023-06-04 DIAGNOSIS — M25511 Pain in right shoulder: Secondary | ICD-10-CM | POA: Diagnosis not present

## 2023-06-05 DIAGNOSIS — M24611 Ankylosis, right shoulder: Secondary | ICD-10-CM | POA: Diagnosis not present

## 2023-06-05 DIAGNOSIS — S46011A Strain of muscle(s) and tendon(s) of the rotator cuff of right shoulder, initial encounter: Secondary | ICD-10-CM | POA: Diagnosis not present

## 2023-06-06 DIAGNOSIS — Z9889 Other specified postprocedural states: Secondary | ICD-10-CM | POA: Diagnosis not present

## 2023-06-06 DIAGNOSIS — M25511 Pain in right shoulder: Secondary | ICD-10-CM | POA: Diagnosis not present

## 2023-06-07 ENCOUNTER — Encounter: Payer: Self-pay | Admitting: Dermatology

## 2023-06-07 ENCOUNTER — Other Ambulatory Visit: Payer: Self-pay | Admitting: Orthopedic Surgery

## 2023-06-10 ENCOUNTER — Encounter: Payer: Self-pay | Admitting: Orthopedic Surgery

## 2023-06-10 NOTE — Anesthesia Preprocedure Evaluation (Addendum)
Anesthesia Evaluation  Patient identified by MRN, date of birth, ID band Patient awake    Reviewed: Allergy & Precautions, H&P , NPO status , Patient's Chart, lab work & pertinent test results  Airway Mallampati: III  TM Distance: <3 FB Neck ROM: Full    Dental no notable dental hx.    Pulmonary neg pulmonary ROS, asthma    Pulmonary exam normal breath sounds clear to auscultation       Cardiovascular hypertension, Normal cardiovascular exam+ dysrhythmias  Rhythm:Regular Rate:Normal     Neuro/Psych negative neurological ROS  negative psych ROS   GI/Hepatic negative GI ROS, Neg liver ROS,,,  Endo/Other  negative endocrine ROS    Renal/GU negative Renal ROS  negative genitourinary   Musculoskeletal negative musculoskeletal ROS (+)    Abdominal   Peds negative pediatric ROS (+)  Hematology negative hematology ROS (+)   Anesthesia Other Findings Vertigo  Hypertension Deaf, right  Wears contact lenses Asthma  Hx of squamous cell carcinoma Actinic keratosis  RBBB (right bundle branch block)    Reproductive/Obstetrics negative OB ROS                             Anesthesia Physical Anesthesia Plan  ASA: 3  Anesthesia Plan: General ETT   Post-op Pain Management: Regional block   Induction: Intravenous  PONV Risk Score and Plan:   Airway Management Planned: Oral ETT  Additional Equipment:   Intra-op Plan:   Post-operative Plan: Extubation in OR  Informed Consent: I have reviewed the patients History and Physical, chart, labs and discussed the procedure including the risks, benefits and alternatives for the proposed anesthesia with the patient or authorized representative who has indicated his/her understanding and acceptance.     Dental Advisory Given  Plan Discussed with: Anesthesiologist, CRNA and Surgeon  Anesthesia Plan Comments: (Patient consented for risks of  anesthesia including but not limited to:  - adverse reactions to medications - damage to eyes, teeth, lips or other oral mucosa - nerve damage due to positioning  - sore throat or hoarseness - Damage to heart, brain, nerves, lungs, other parts of body or loss of life  Patient voiced understanding.)       Anesthesia Quick Evaluation

## 2023-06-11 DIAGNOSIS — M25511 Pain in right shoulder: Secondary | ICD-10-CM | POA: Diagnosis not present

## 2023-06-11 DIAGNOSIS — Z9889 Other specified postprocedural states: Secondary | ICD-10-CM | POA: Diagnosis not present

## 2023-06-13 DIAGNOSIS — M25511 Pain in right shoulder: Secondary | ICD-10-CM | POA: Diagnosis not present

## 2023-06-13 DIAGNOSIS — Z9889 Other specified postprocedural states: Secondary | ICD-10-CM | POA: Diagnosis not present

## 2023-06-17 ENCOUNTER — Encounter: Payer: Self-pay | Admitting: Orthopedic Surgery

## 2023-06-17 ENCOUNTER — Ambulatory Visit
Admission: RE | Admit: 2023-06-17 | Discharge: 2023-06-17 | Disposition: A | Discharge status: Home / Self Care | Payer: PPO | Source: Home / Self Care | Attending: Orthopedic Surgery | Admitting: Orthopedic Surgery

## 2023-06-17 ENCOUNTER — Encounter
Admission: RE | Disposition: A | Discharge status: Home / Self Care | Payer: Self-pay | Source: Home / Self Care | Attending: Orthopedic Surgery

## 2023-06-17 ENCOUNTER — Other Ambulatory Visit: Payer: Self-pay

## 2023-06-17 ENCOUNTER — Ambulatory Visit: Payer: PPO | Admitting: Anesthesiology

## 2023-06-17 DIAGNOSIS — I10 Essential (primary) hypertension: Secondary | ICD-10-CM | POA: Insufficient documentation

## 2023-06-17 DIAGNOSIS — M7541 Impingement syndrome of right shoulder: Secondary | ICD-10-CM | POA: Diagnosis not present

## 2023-06-17 DIAGNOSIS — G8918 Other acute postprocedural pain: Secondary | ICD-10-CM | POA: Diagnosis not present

## 2023-06-17 DIAGNOSIS — M24611 Ankylosis, right shoulder: Secondary | ICD-10-CM | POA: Diagnosis not present

## 2023-06-17 DIAGNOSIS — M7501 Adhesive capsulitis of right shoulder: Secondary | ICD-10-CM | POA: Insufficient documentation

## 2023-06-17 HISTORY — PX: CLOSED MANIPULATION SHOULDER WITH STERIOD INJECTION: SHX5611

## 2023-06-17 SURGERY — CLOSED MANIPULATION SHOULDER WITH STEROID INJECTION
Anesthesia: General | Site: Shoulder | Laterality: Right

## 2023-06-17 MED ORDER — BUPIVACAINE LIPOSOME 1.3 % IJ SUSP
INTRAMUSCULAR | Status: DC | PRN
  Administered 2023-06-17: 10 mg via PERINEURAL

## 2023-06-17 MED ORDER — ONDANSETRON HCL 4 MG/2ML IJ SOLN
INTRAMUSCULAR | Status: DC | PRN
Start: 2023-06-17 — End: 2023-06-17
  Administered 2023-06-17: 4 mg via INTRAVENOUS

## 2023-06-17 MED ORDER — EPHEDRINE SULFATE (PRESSORS) 50 MG/ML IJ SOLN
INTRAMUSCULAR | Status: DC | PRN
Start: 2023-06-17 — End: 2023-06-17
  Administered 2023-06-17: 10 mg via INTRAVENOUS

## 2023-06-17 MED ORDER — LACTATED RINGERS IR SOLN
Status: DC | PRN
  Administered 2023-06-17 (×4): 3000 mL

## 2023-06-17 MED ORDER — CEFAZOLIN SODIUM-DEXTROSE 2-4 GM/100ML-% IV SOLN
2.0000 g | INTRAVENOUS | Status: AC
  Administered 2023-06-17: 2 g via INTRAVENOUS

## 2023-06-17 MED ORDER — LIDOCAINE HCL (CARDIAC) PF 100 MG/5ML IV SOSY
PREFILLED_SYRINGE | INTRAVENOUS | Status: DC | PRN
  Administered 2023-06-17: 100 mg via INTRAVENOUS

## 2023-06-17 MED ORDER — BUPIVACAINE HCL (PF) 0.25 % IJ SOLN
INTRAMUSCULAR | Status: DC | PRN
  Administered 2023-06-17: 30 mL via PERINEURAL

## 2023-06-17 MED ORDER — SUCCINYLCHOLINE CHLORIDE 200 MG/10ML IV SOSY
PREFILLED_SYRINGE | INTRAVENOUS | Status: DC | PRN
  Administered 2023-06-17: 100 mg via INTRAVENOUS

## 2023-06-17 MED ORDER — ASPIRIN 325 MG PO TBEC: 325.0000 mg | DELAYED_RELEASE_TABLET | Freq: Every day | ORAL | 0 refills | Status: AC

## 2023-06-17 MED ORDER — DEXAMETHASONE SODIUM PHOSPHATE 10 MG/ML IJ SOLN
INTRAMUSCULAR | Status: DC | PRN
  Administered 2023-06-17: 10 mg

## 2023-06-17 MED ORDER — LACTATED RINGERS IV SOLN: INTRAVENOUS | Status: DC

## 2023-06-17 MED ORDER — TRIAMCINOLONE ACETONIDE 40 MG/ML IJ SUSP
INTRAMUSCULAR | Status: DC | PRN
  Administered 2023-06-17 (×2): 5 mL via INTRAMUSCULAR

## 2023-06-17 MED ORDER — MIDAZOLAM HCL 2 MG/2ML IJ SOLN
1.0000 mg | Freq: Once | INTRAMUSCULAR | Status: AC
  Administered 2023-06-17 (×2): 1 mg via INTRAVENOUS

## 2023-06-17 MED ORDER — DEXAMETHASONE SODIUM PHOSPHATE 4 MG/ML IJ SOLN
INTRAMUSCULAR | Status: DC | PRN
  Administered 2023-06-17: 4 mg via INTRAVENOUS

## 2023-06-17 MED ORDER — PROPOFOL 10 MG/ML IV BOLUS
INTRAVENOUS | Status: DC | PRN
  Administered 2023-06-17: 150 mg via INTRAVENOUS
  Administered 2023-06-17: 50 mg via INTRAVENOUS

## 2023-06-17 MED ORDER — BUPIVACAINE HCL 0.5 % IJ SOLN: INTRAMUSCULAR | Status: DC | PRN

## 2023-06-17 MED ORDER — FENTANYL CITRATE (PF) 100 MCG/2ML IJ SOLN
INTRAMUSCULAR | Status: DC | PRN
  Administered 2023-06-17 (×2): 25 ug via INTRAVENOUS
  Administered 2023-06-17: 50 ug via INTRAVENOUS

## 2023-06-17 SURGICAL SUPPLY — 38 items
ADPR IRR PORT MULTIBAG TUBE (MISCELLANEOUS) ×1
APL PRP STRL LF DISP 70% ISPRP (MISCELLANEOUS) ×1
BLADE SHAVER 4.5X7 STR FR (MISCELLANEOUS) ×1 IMPLANT
CANNULA PART THRD DISP 5.75X7 (CANNULA) ×1 IMPLANT
CHLORAPREP W/TINT 26 (MISCELLANEOUS) ×1 IMPLANT
COOLER POLAR GLACIER W/PUMP (MISCELLANEOUS) ×1 IMPLANT
COVER LIGHT HANDLE UNIVERSAL (MISCELLANEOUS) ×2 IMPLANT
DRAPE U-SHAPE 48X52 POLY STRL (PACKS) ×2 IMPLANT
DRSG TEGADERM 4X4.75 (GAUZE/BANDAGES/DRESSINGS) IMPLANT
GAUZE SPONGE 4X4 12PLY STRL (GAUZE/BANDAGES/DRESSINGS) ×1 IMPLANT
GAUZE XEROFORM 1X8 LF (GAUZE/BANDAGES/DRESSINGS) ×1 IMPLANT
GLOVE SRG 8 PF TXTR STRL LF DI (GLOVE) ×1 IMPLANT
GLOVE SURG ENC MOIS LTX SZ7.5 (GLOVE) ×2 IMPLANT
GLOVE SURG ENC MOIS LTX SZ8 (GLOVE) ×1 IMPLANT
GLOVE SURG UNDER POLY LF SZ8 (GLOVE) ×1
GOWN STRL REUS W/ TWL LRG LVL3 (GOWN DISPOSABLE) ×1 IMPLANT
GOWN STRL REUS W/ TWL XL LVL3 (GOWN DISPOSABLE) ×1 IMPLANT
GOWN STRL REUS W/TWL LRG LVL3 (GOWN DISPOSABLE) ×1
GOWN STRL REUS W/TWL XL LVL3 (GOWN DISPOSABLE) ×1
IV LACTATED RINGER IRRG 3000ML (IV SOLUTION) ×4
IV LR IRRIG 3000ML ARTHROMATIC (IV SOLUTION) ×6 IMPLANT
KIT STABILIZATION SHOULDER (MISCELLANEOUS) ×1 IMPLANT
KIT TURNOVER KIT A (KITS) ×1 IMPLANT
MANIFOLD NEPTUNE II (INSTRUMENTS) ×2 IMPLANT
MASK FACE SPIDER DISP (MASK) ×1 IMPLANT
MAT ABSORB FLUID 56X50 GRAY (MISCELLANEOUS) ×2 IMPLANT
PACK ARTHROSCOPY SHOULDER (MISCELLANEOUS) ×1 IMPLANT
PAD ABD DERMACEA PRESS 5X9 (GAUZE/BANDAGES/DRESSINGS) IMPLANT
PAD WRAPON POLAR SHDR XLG (MISCELLANEOUS) ×1 IMPLANT
SET Y ADAPTER MULIT-BAG IRRIG (MISCELLANEOUS) ×1 IMPLANT
SUT ETHILON 3-0 FS-10 30 BLK (SUTURE) ×1
SUTURE EHLN 3-0 FS-10 30 BLK (SUTURE) IMPLANT
SYR 5ML LL (SYRINGE) ×1 IMPLANT
TAPE MICROFOAM 4IN (TAPE) ×1 IMPLANT
TUBING INFLOW SET DBFLO PUMP (TUBING) ×1 IMPLANT
TUBING OUTFLOW SET DBLFO PUMP (TUBING) ×1 IMPLANT
WAND WEREWOLF FLOW 90D (MISCELLANEOUS) ×1 IMPLANT
WRAPON POLAR PAD SHDR XLG (MISCELLANEOUS) ×1

## 2023-06-17 NOTE — Transfer of Care (Signed)
Immediate Anesthesia Transfer of Care Note  Patient: Allison Shelton  Procedure(s) Performed: Right shoulder arthroscopic lysis of adhesions, capsular release, and manipulation under anesthesia with corticosteroid injection (Right: Shoulder)  Patient Location: PACU  Anesthesia Type: General ETT  Level of Consciousness: awake, alert  and patient cooperative  Airway and Oxygen Therapy: Patient Spontanous Breathing and Patient connected to supplemental oxygen  Post-op Assessment: Post-op Vital signs reviewed, Patient's Cardiovascular Status Stable, Respiratory Function Stable, Patent Airway and No signs of Nausea or vomiting  Post-op Vital Signs: Reviewed and stable  Complications: No notable events documented.

## 2023-06-17 NOTE — Anesthesia Procedure Notes (Signed)
Procedure Name: Intubation Date/Time: 06/17/2023 12:35 PM  Performed by: Barbette Hair, CRNAPre-anesthesia Checklist: Patient identified, Emergency Drugs available, Suction available, Patient being monitored and Timeout performed Patient Re-evaluated:Patient Re-evaluated prior to induction Oxygen Delivery Method: Circle system utilized Preoxygenation: Pre-oxygenation with 100% oxygen Induction Type: IV induction Ventilation: Mask ventilation without difficulty and Oral airway inserted - appropriate to patient size Laryngoscope Size: McGraph and 4 Grade View: Grade I Tube type: Oral Tube size: 7.0 mm Number of attempts: 1 Airway Equipment and Method: Stylet

## 2023-06-17 NOTE — H&P (Signed)
Paper H&P to be scanned into permanent record. H&P reviewed. No significant changes noted.  

## 2023-06-17 NOTE — Anesthesia Procedure Notes (Signed)
Anesthesia Regional Block: Interscalene brachial plexus block   Pre-Anesthetic Checklist: , timeout performed,  Correct Patient, Correct Site, Correct Laterality,  Correct Procedure, Correct Position, site marked,  Risks and benefits discussed,  Surgical consent,  Pre-op evaluation,  At surgeon's request and post-op pain management  Laterality: Right  Prep: chloraprep       Needles:  Injection technique: Single-shot  Needle Type: Stimiplex     Needle Length: 10cm  Needle Gauge: 21     Additional Needles:   Procedures:,,,, ultrasound used (permanent image in chart),,    Narrative:  Start time: 06/17/2023 11:47 AM End time: 06/17/2023 12:02 PM Injection made incrementally with aspirations every 5 mL.  Performed by: Personally  Anesthesiologist: Marisue Humble, MD  Additional Notes: Functioning IV was confirmed and monitors applied. Ultrasound guidance: relevant anatomy identified, needle position confirmed, local anesthetic spread visualized around nerve(s)., vascular puncture avoided.  Image printed for medical record.  Negative aspiration and no paresthesias; incremental administration of local anesthetic. The patient tolerated the procedure well. Vitals signs recorded in RN notes. Plus superficial cervical plexus block as field block and intercostobrachial block  as field block on right side.

## 2023-06-17 NOTE — Op Note (Signed)
OPERATIVE NOTE SURGERY DATE: 06/17/2023  PRE-OP DIAGNOSIS: 1. Right shoulder postoperative arthrofibrosis 2. Right shoulder adhesions   POST-OP DIAGNOSIS:  1. Right shoulder postoperative arthrofibrosis 2. Right shoulder adhesions  PROCEDURES: 1. Right shoulder capsular releases with synovectomy, lysis of adhesions, manipulation under anesthesia  2. Right subacromial decompression without acromioplasty  3. Right glenohumeral and subacromial injections with corticosteroid  SURGEON:  Rosealee Albee, MD  ASSISTANT(S):  none  ANESTHESIA: Regional block with Exparel, Gen  TOTAL IV FLUIDS: per anesthesia record   ESTIMATED BLOOD LOSS: Minimal  DRAINS:  None.  SPECIMENS: None  IMPLANTS: None.  COMPLICATIONS: none  INDICATIONS: Allison Shelton is a 72 y.o. female Who initially underwent right arthroscopic subscapularis repair, mini open superior rotator cuff repair, distal clavicle excision, and subacromial decompression by me on 01/08/2023. Preoperative left shoulder examination was notable for excellent strength to rotator cuff testing, but she had persistent stiffness in all planes of motion that did not improve with aggressive physical therapy and stretching exercises. Surgery was recommended for capsular releases, manipulation under anesthesia, and subacromial decompression/bursectomy with corticosteroid injections into glenohumeral joint and subacromial space. After discussion of risks, benefits, and alternatives to surgery, the patient elected to proceed.    OPERATIVE FINDINGS:  Operative Shoulder Range of Motion:  Preop  Postop  Flexion  100 140  Abduction  70 120  ER at 0  20 45  ER at 90  60 90  IR at 90  20 45  IR posterior  L2 T8    Intra-operative findings: A thorough arthroscopic examination of the shoulder was performed.  The findings are: 1. Biceps tendon: Not visualized 2. Superior labrum: Significant erythema 3. Posterior labrum and capsule: Significant  synovitis and thickening of capsule 4. Inferior capsule and inferior recess: Significant synovitis and thickening of capsule 5. Glenoid cartilage surface: Areas of grade 2-3 degenerative change 6. Supraspinatus attachment: Healed rotator cuff 7. Posterior rotator cuff attachment:  Normal 8. Humeral head articular cartilage: Areas of grade 2-3 degenerative change 9. Rotator interval: significant synovitis and thickening of capsule 10: Subscapularis tendon: Healed rotator cuff 11. Anterior labrum: Significant erythema 12. IGHL: significant synovitis around IGHL   DETAILS OF PROCEDURE: The patient was identified in the preoperative holding area. Informed consent was obtained. Operative extremity was marked. After satisfactory upper extremity regional block with Exparel was performed in the preoperative holding area, the patient was brought to the operating room and placed in a well-padded beach chair positioner.  Eyes were protected, head was affixed in neutral, and the patient was given preoperative IV antibiotics within 30 minutes of the start of the case and a surgical time-out occurred. The upper extremity was prescrubbed with Hibiclens and alcohol, prepped with ChloraPrep and draped in the usual sterile fashion.    I then created a standard posterior portal with an 11 blade. The glenohumeral joint was entered with a blunt trochar and the arthroscope introduced. The findings of diagnostic arthroscopy are described above.  A standard anterior portal was made.  The joint was remarkable for moderate synovitis which was chronic in the anterior, superior, posterior, and inferior aspects. This required synovectomy with a shaver and Arthrocare device in the affected compartments listed above.  A combination of electrocautery and oscillating shaver was used to debride the rotator interval tissue.  The posterior aspect of the coracoid was exposed.  The adhesions off of the anterior and posterior aspects of  the subscapularis were cleared so there was no tethering. Next, an upbiting  duckbill basket was then used to perform a capsulotomy of the rotator interval and then the MGHL and the IGHL (anterior band).  Care was taken to protect the intraarticular subscapularis.  Adhesions were cleared off the subscapularis to allow full internal and external rotation.    The arthroscope was placed into the anterior portal.  The posterior capsule was quite thickened and inflamed as well.  After synovectomy, the duckbill basket was used to perform release from the superior glenoid, down into the axillary pouch, around to the anterior band of the IGHL.  A complete 360 capsulotomy was performed in this manner.  Care was taken to protect the axillary nerve by staying on the glenoid side and making sure not to rotate the shoulder externally during the capsulotomy.  Hemostasis was achieved with the ArthroCare wand.  There was no unusual bleeding.    The arthroscope was placed in the subacromial space. An accessory lateral portal was established. There was severe bursitis filling the whole subacromial space and gutters.  There were significant adhesions in the subacromial space as well.  A complete subacromial bursectomy and debridement of the gutters and subacromial space was carried out with a shaver.  ArthroCare was used to control bleeding. The instruments were removed from the shoulder.    Manipulation under anesthesia was carried out in a gentle, controlled manner with short lever arms.  There was palpable release of adhesions. See above chart for post-manipulation improvement in range of motion.   The skin was closed with interrupted 3-0 nylon sutures. Injections of 40 mg Kenalog with and 0.5% ropivacaine were placed separately in the glenohumeral joint and subacromial space with a spinal needle.  Xeroform gauze, sterile dressings were applied. The patient was placed in a shoulder sling.  Polar Care was applied.     Instrument, sponge, and needle counts were correct prior to closure and at the conclusion of the case.   DISPOSITION: PACU - hemodynamically stable.  POSTOPERATIVE PLAN: The patient will be discharged home. PT to begin 1 day postop for range of motion exercises.  ASA x 2 weeks for DVT ppx. Sling only for comfort and wean this week as soon as tolerated. RTC in ~2 weeks.

## 2023-06-17 NOTE — Anesthesia Postprocedure Evaluation (Signed)
Anesthesia Post Note  Patient: Allison Shelton  Procedure(s) Performed: Right shoulder arthroscopic lysis of adhesions, capsular release, and manipulation under anesthesia with corticosteroid injection (Right: Shoulder)  Patient location during evaluation: PACU Anesthesia Type: General Level of consciousness: awake and alert Pain management: pain level controlled Vital Signs Assessment: post-procedure vital signs reviewed and stable Respiratory status: spontaneous breathing, nonlabored ventilation, respiratory function stable and patient connected to nasal cannula oxygen Cardiovascular status: blood pressure returned to baseline and stable Postop Assessment: no apparent nausea or vomiting Anesthetic complications: no   No notable events documented.   Last Vitals:  Vitals:   06/17/23 1415 06/17/23 1421  BP: 121/65   Pulse: 77 87  Resp: 16 (!) 26  Temp:    SpO2: 93% 98%    Last Pain:  Vitals:   06/17/23 1421  TempSrc:   PainSc: 0-No pain                 Dua Mehler C Mollyann Halbert

## 2023-06-17 NOTE — Discharge Instructions (Addendum)
Post-Op Instructions - Shoulder Capsular Release/Manipulation Under Anesthesia  1. Bracing: You should wear a sling for comfort only. Sling should NOT be worn longer than ~1 week.   2. Driving: No driving for 1 week post-op. Must be off narcotic pain medication.  3. Activity: No active lifting for ~2 weeks. Perform range of motion exercises DAILY at home and with physical therapy as prescribed.   4. Physical Therapy: This NEEDS to begin the day after surgery, and proceed ~6-12 weeks. This should be at least 3x/week.   5. Medications:  - You will be provided a prescription for narcotic pain medicine. After surgery, take 1-2 narcotic tablets every 4 hours if needed for severe pain.  - A prescription for anti-nausea medication will be provided in case the narcotic medicine causes nausea - take 1 tablet every 6 hours only if nauseated.   - Take tylenol 1000 mg (2 Extra Strength tablets or 3 regular strength) every 8 hours for pain.  May decrease or stop tylenol 5 days after surgery if you are having minimal pain. - Take ibuprofen 800mg  three times/day with food for at least two weeks every day. This will help reduce post-operative inflammation and swelling. Please call our offices if this causes any stomach/GI irritation.  - Take Aspirin 325mg /daily x 2 weeks to help prevent DVT/PE (Blood clots)   If you are taking prescription medication for anxiety, depression, insomnia, muscle spasm, chronic pain, or for attention deficit disorder, you are advised that you are at a higher risk of adverse effects with use of narcotics post-op, including narcotic addiction/dependence, depressed breathing, death. If you use non-prescribed substances: alcohol, marijuana, cocaine, heroin, methamphetamines, etc., you are at a higher risk of adverse effects with use of narcotics post-op, including narcotic addiction/dependence, depressed breathing, death. You are advised that taking > 50 morphine milligram equivalents  (MME) of narcotic pain medication per day results in twice the risk of overdose or death. For your prescription provided: oxycodone 5 mg - taking more than 6 tablets per day would result in > 50 morphine milligram equivalents (MME) of narcotic pain medication. Be advised that we will prescribe narcotics short-term, for acute post-operative pain only - 3 weeks for major operations such as shoulder repair/reconstruction surgeries.    6. Post-Op Appointment:  Your first post-op appointment will be ~2 weeks post-op.  7. Work or School: For most, but not all procedures, we advise staying out of work or school for at least 1 to 2 weeks in order to recover from the stress of surgery and to allow time for healing.   If you need a work or school note this can be provided.           PERIPHERAL NERVE BLOCK PATIENT INFORMATION  Your surgeon has requested a peripheral nerve block for your surgery. This anesthetic technique provides excellent post-operative pain relief for you in a safe and effective manner. It will also help reduce the risk of nausea and vomiting and allow earlier discharge from the hospital.   The block is performed under sedation with ultrasound guidance prior to your procedure. Due to the sedation, your may or may not remember the block experience. The nerve block will begin to take effect anywhere from 5 to 30 minutes after being administered. You will be transported to the operating room from your surgery after the block is completed.   At the end of surgery, when the anesthesia wears off, you will notice a few things. Your may not be  able to move or feel the part of your body targeted by the nerve block. These are normal experiences, and they will disappear as the block wears off.  If you had an interscalene nerve block performed (which is common for shoulder surgery), your voice can be very hoarse and you may feel that you are not able to take as deep a breath as you did before  surgery. Some patients may also notice a droopy eyelid on the affected side. These symptoms will resolve once the block wears off.  Pain control: The nerve block technique used is a single injection that can last anywhere from 1-3 days. The duration of the numbness can vary between individuals. After leaving the hospital, it is important that you begin to take your prescribed pain medication when you start to sense the nerve block wearing off. This will help you avoid unpleasant pain at the time the nerve block wears off, which can sometimes be in the middle of the night. The block will only cover pain in the areas targeted by the nerve block so if you experience surgical pain outside of that area, please take your prescribed pain medication. Management of the "numb area": After a nerve block, you cannot feel pain, pressure, or temperature in the affected area so there is an increased risk for injury. You should take extra care to protect the affected areas until sensation and movement returns. Please take caution to not come in contact with extremely hot or cold items because you will not be able to sense or protect yourself form the extremes of temperature.  You may experience some persistent numbness after the procedure by most neurological deficits resolve over time and the incidence of serious long term neurological complications attributable to peripheral nerve blocks are relatively uncommon.  POLAR CARE INFORMATION  MassAdvertisement.it  How to use [facility] Polar Care [facility] Therapy System?  YouTube   ShippingScam.co.uk  OPERATING INSTRUCTIONS  Start the product With dry hands, connect the transformer to the electrical connection located on the top of the cooler. Next, plug the transformer into an appropriate electrical outlet. The unit will automatically start running at this point.  To stop the pump, disconnect electrical power.  Unplug to stop the product when not in  use. Unplugging the Polar Care unit turns it off. Always unplug immediately after use. Never leave it plugged in while unattended. Remove pad.    FIRST ADD WATER TO FILL LINE, THEN ICE---Replace ice when existing ice is almost melted  1 Discuss Treatment with your Licensed Health Care Practitioner and Use Only as Prescribed 2 Apply Insulation Barrier & Cold Therapy Pad 3 Check for Moisture 4 Inspect Skin Regularly  Tips and Trouble Shooting Usage Tips 1. Use cubed or chunked ice for optimal performance. 2. It is recommended to drain the Pad between uses. To drain the pad, hold the Pad upright with the hose pointed toward the ground. Depress the black plunger and allow water to drain out. 3. You may disconnect the Pad from the unit without removing the pad from the affected area by depressing the silver tabs on the hose coupling and gently pulling the hoses apart. The Pad and unit will seal itself and will not leak. Note: Some dripping during release is normal. 4. DO NOT RUN PUMP WITHOUT WATER! The pump in this unit is designed to run with water. Running the unit without water will cause permanent damage to the pump. 5. Unplug unit before removing lid.  TROUBLESHOOTING  GUIDE Pump not running, Water not flowing to the pad, Pad is not getting cold 1. Make sure the transformer is plugged into the wall outlet. 2. Confirm that the ice and water are filled to the indicated levels. 3. Make sure there are no kinks in the pad. 4. Gently pull on the blue tube to make sure the tube/pad junction is straight. 5. Remove the pad from the treatment site and ll it while the pad is lying at; then reapply. 6. Confirm that the pad couplings are securely attached to the unit. Listen for the double clicks (Figure 1) to confirm the pad couplings are securely attached.  Leaks    Note: Some condensation on the lines, controller, and pads is unavoidable, especially in warmer climates. 1. If using a [employer] Polar Care  Cold Therapy unit with a detachable Cold Therapy Pad, and a leak exists (other than condensation on the lines) disconnect the pad couplings. Make sure the silver tabs on the couplings are depressed before reconnecting the pad to the pump hose; then confirm both sides of the coupling are properly clicked in. 2. If the coupling continues to leak or a leak is detected in the pad itself, stop using it and call [employer] Customer Care at [phone].  Cleaning After use, empty and dry the unit with a soft cloth. Warm water and mild detergent may be used occasionally to clean the pump and tubes.  WARNING: The Polar Care Cube can be cold enough to cause serious injury, including full skin necrosis. Follow these Operating Instructions, and carefully read the Product Insert (see pouch on side of unit) and the Cold Therapy Pad Fitting Instructions (provided with each Cold Therapy Pad) prior to use.

## 2023-06-18 DIAGNOSIS — M25511 Pain in right shoulder: Secondary | ICD-10-CM | POA: Diagnosis not present

## 2023-06-18 DIAGNOSIS — Z9889 Other specified postprocedural states: Secondary | ICD-10-CM | POA: Diagnosis not present

## 2023-06-19 ENCOUNTER — Encounter: Payer: Self-pay | Admitting: Orthopedic Surgery

## 2023-06-19 DIAGNOSIS — M25511 Pain in right shoulder: Secondary | ICD-10-CM | POA: Diagnosis not present

## 2023-06-19 DIAGNOSIS — Z9889 Other specified postprocedural states: Secondary | ICD-10-CM | POA: Diagnosis not present

## 2023-06-20 DIAGNOSIS — Z9889 Other specified postprocedural states: Secondary | ICD-10-CM | POA: Diagnosis not present

## 2023-06-20 DIAGNOSIS — M25511 Pain in right shoulder: Secondary | ICD-10-CM | POA: Diagnosis not present

## 2023-06-21 DIAGNOSIS — M25511 Pain in right shoulder: Secondary | ICD-10-CM | POA: Diagnosis not present

## 2023-06-21 DIAGNOSIS — Z9889 Other specified postprocedural states: Secondary | ICD-10-CM | POA: Diagnosis not present

## 2023-06-28 DIAGNOSIS — M25511 Pain in right shoulder: Secondary | ICD-10-CM | POA: Diagnosis not present

## 2023-06-28 DIAGNOSIS — Z9889 Other specified postprocedural states: Secondary | ICD-10-CM | POA: Diagnosis not present

## 2023-07-01 DIAGNOSIS — Z9889 Other specified postprocedural states: Secondary | ICD-10-CM | POA: Diagnosis not present

## 2023-07-01 DIAGNOSIS — M25511 Pain in right shoulder: Secondary | ICD-10-CM | POA: Diagnosis not present

## 2023-07-03 DIAGNOSIS — M25511 Pain in right shoulder: Secondary | ICD-10-CM | POA: Diagnosis not present

## 2023-07-03 DIAGNOSIS — Z9889 Other specified postprocedural states: Secondary | ICD-10-CM | POA: Diagnosis not present

## 2023-07-08 DIAGNOSIS — Z9889 Other specified postprocedural states: Secondary | ICD-10-CM | POA: Diagnosis not present

## 2023-07-08 DIAGNOSIS — M25511 Pain in right shoulder: Secondary | ICD-10-CM | POA: Diagnosis not present

## 2023-07-10 DIAGNOSIS — Z9889 Other specified postprocedural states: Secondary | ICD-10-CM | POA: Diagnosis not present

## 2023-07-10 DIAGNOSIS — M25511 Pain in right shoulder: Secondary | ICD-10-CM | POA: Diagnosis not present

## 2023-07-15 DIAGNOSIS — Z9889 Other specified postprocedural states: Secondary | ICD-10-CM | POA: Diagnosis not present

## 2023-07-15 DIAGNOSIS — M25511 Pain in right shoulder: Secondary | ICD-10-CM | POA: Diagnosis not present

## 2023-07-23 DIAGNOSIS — M25511 Pain in right shoulder: Secondary | ICD-10-CM | POA: Diagnosis not present

## 2023-07-23 DIAGNOSIS — Z9889 Other specified postprocedural states: Secondary | ICD-10-CM | POA: Diagnosis not present

## 2023-07-26 DIAGNOSIS — M25511 Pain in right shoulder: Secondary | ICD-10-CM | POA: Diagnosis not present

## 2023-07-26 DIAGNOSIS — Z9889 Other specified postprocedural states: Secondary | ICD-10-CM | POA: Diagnosis not present

## 2023-07-29 DIAGNOSIS — Z9889 Other specified postprocedural states: Secondary | ICD-10-CM | POA: Diagnosis not present

## 2023-07-29 DIAGNOSIS — M25511 Pain in right shoulder: Secondary | ICD-10-CM | POA: Diagnosis not present

## 2023-08-01 DIAGNOSIS — Z9889 Other specified postprocedural states: Secondary | ICD-10-CM | POA: Diagnosis not present

## 2023-08-01 DIAGNOSIS — M25511 Pain in right shoulder: Secondary | ICD-10-CM | POA: Diagnosis not present

## 2023-08-05 DIAGNOSIS — M25511 Pain in right shoulder: Secondary | ICD-10-CM | POA: Diagnosis not present

## 2023-08-05 DIAGNOSIS — Z9889 Other specified postprocedural states: Secondary | ICD-10-CM | POA: Diagnosis not present

## 2023-08-09 DIAGNOSIS — M25511 Pain in right shoulder: Secondary | ICD-10-CM | POA: Diagnosis not present

## 2023-08-09 DIAGNOSIS — Z9889 Other specified postprocedural states: Secondary | ICD-10-CM | POA: Diagnosis not present

## 2023-08-12 DIAGNOSIS — Z9889 Other specified postprocedural states: Secondary | ICD-10-CM | POA: Diagnosis not present

## 2023-08-12 DIAGNOSIS — M25511 Pain in right shoulder: Secondary | ICD-10-CM | POA: Diagnosis not present

## 2023-08-16 DIAGNOSIS — M25511 Pain in right shoulder: Secondary | ICD-10-CM | POA: Diagnosis not present

## 2023-08-16 DIAGNOSIS — Z9889 Other specified postprocedural states: Secondary | ICD-10-CM | POA: Diagnosis not present

## 2023-08-19 DIAGNOSIS — Z9889 Other specified postprocedural states: Secondary | ICD-10-CM | POA: Diagnosis not present

## 2023-08-19 DIAGNOSIS — M25511 Pain in right shoulder: Secondary | ICD-10-CM | POA: Diagnosis not present

## 2023-08-21 DIAGNOSIS — Z9889 Other specified postprocedural states: Secondary | ICD-10-CM | POA: Diagnosis not present

## 2023-08-21 DIAGNOSIS — M25511 Pain in right shoulder: Secondary | ICD-10-CM | POA: Diagnosis not present

## 2023-08-26 DIAGNOSIS — Z9889 Other specified postprocedural states: Secondary | ICD-10-CM | POA: Diagnosis not present

## 2023-08-26 DIAGNOSIS — M25511 Pain in right shoulder: Secondary | ICD-10-CM | POA: Diagnosis not present

## 2023-08-28 DIAGNOSIS — M25511 Pain in right shoulder: Secondary | ICD-10-CM | POA: Diagnosis not present

## 2023-08-28 DIAGNOSIS — Z9889 Other specified postprocedural states: Secondary | ICD-10-CM | POA: Diagnosis not present

## 2023-09-02 DIAGNOSIS — Z9889 Other specified postprocedural states: Secondary | ICD-10-CM | POA: Diagnosis not present

## 2023-09-02 DIAGNOSIS — M25511 Pain in right shoulder: Secondary | ICD-10-CM | POA: Diagnosis not present

## 2023-09-09 DIAGNOSIS — Z9889 Other specified postprocedural states: Secondary | ICD-10-CM | POA: Diagnosis not present

## 2023-09-09 DIAGNOSIS — M25511 Pain in right shoulder: Secondary | ICD-10-CM | POA: Diagnosis not present

## 2023-09-17 DIAGNOSIS — M25511 Pain in right shoulder: Secondary | ICD-10-CM | POA: Diagnosis not present

## 2023-09-17 DIAGNOSIS — Z9889 Other specified postprocedural states: Secondary | ICD-10-CM | POA: Diagnosis not present

## 2023-09-24 DIAGNOSIS — M25511 Pain in right shoulder: Secondary | ICD-10-CM | POA: Diagnosis not present

## 2023-09-24 DIAGNOSIS — Z9889 Other specified postprocedural states: Secondary | ICD-10-CM | POA: Diagnosis not present

## 2023-09-25 DIAGNOSIS — M24611 Ankylosis, right shoulder: Secondary | ICD-10-CM | POA: Diagnosis not present

## 2023-09-25 DIAGNOSIS — S46011S Strain of muscle(s) and tendon(s) of the rotator cuff of right shoulder, sequela: Secondary | ICD-10-CM | POA: Diagnosis not present

## 2023-11-04 DIAGNOSIS — H903 Sensorineural hearing loss, bilateral: Secondary | ICD-10-CM | POA: Diagnosis not present

## 2023-11-19 DIAGNOSIS — Z860101 Personal history of adenomatous and serrated colon polyps: Secondary | ICD-10-CM | POA: Diagnosis not present

## 2023-11-19 DIAGNOSIS — Z01818 Encounter for other preprocedural examination: Secondary | ICD-10-CM | POA: Diagnosis not present

## 2024-01-14 ENCOUNTER — Encounter: Payer: Self-pay | Admitting: *Deleted

## 2024-01-23 ENCOUNTER — Encounter: Payer: Self-pay | Admitting: *Deleted

## 2024-01-30 ENCOUNTER — Encounter: Payer: Self-pay | Admitting: *Deleted

## 2024-01-31 ENCOUNTER — Ambulatory Visit: Admitting: Anesthesiology

## 2024-01-31 ENCOUNTER — Encounter
Admission: RE | Disposition: A | Discharge status: Home / Self Care | Payer: Self-pay | Source: Home / Self Care | Attending: Gastroenterology

## 2024-01-31 ENCOUNTER — Encounter: Payer: Self-pay | Admitting: *Deleted

## 2024-01-31 ENCOUNTER — Ambulatory Visit
Admission: RE | Admit: 2024-01-31 | Discharge: 2024-01-31 | Disposition: A | Discharge status: Home / Self Care | Payer: PPO | Attending: Gastroenterology | Admitting: Gastroenterology

## 2024-01-31 DIAGNOSIS — K640 First degree hemorrhoids: Secondary | ICD-10-CM | POA: Diagnosis not present

## 2024-01-31 DIAGNOSIS — K5730 Diverticulosis of large intestine without perforation or abscess without bleeding: Secondary | ICD-10-CM | POA: Diagnosis not present

## 2024-01-31 DIAGNOSIS — Z860101 Personal history of adenomatous and serrated colon polyps: Secondary | ICD-10-CM | POA: Diagnosis not present

## 2024-01-31 DIAGNOSIS — Z1211 Encounter for screening for malignant neoplasm of colon: Secondary | ICD-10-CM | POA: Diagnosis not present

## 2024-01-31 DIAGNOSIS — D175 Benign lipomatous neoplasm of intra-abdominal organs: Secondary | ICD-10-CM | POA: Diagnosis not present

## 2024-01-31 DIAGNOSIS — K649 Unspecified hemorrhoids: Secondary | ICD-10-CM | POA: Diagnosis not present

## 2024-01-31 DIAGNOSIS — E782 Mixed hyperlipidemia: Secondary | ICD-10-CM | POA: Insufficient documentation

## 2024-01-31 DIAGNOSIS — K635 Polyp of colon: Secondary | ICD-10-CM | POA: Diagnosis not present

## 2024-01-31 DIAGNOSIS — D120 Benign neoplasm of cecum: Secondary | ICD-10-CM | POA: Diagnosis not present

## 2024-01-31 HISTORY — DX: Mild intermittent asthma, uncomplicated: J45.20

## 2024-01-31 HISTORY — DX: Deficiency of other specified B group vitamins: E53.8

## 2024-01-31 HISTORY — PX: COLONOSCOPY WITH PROPOFOL: SHX5780

## 2024-01-31 HISTORY — DX: Benign neoplasm, unspecified site: D36.9

## 2024-01-31 HISTORY — PX: POLYPECTOMY: SHX149

## 2024-01-31 HISTORY — DX: Mixed hyperlipidemia: E78.2

## 2024-01-31 SURGERY — COLONOSCOPY WITH PROPOFOL
Anesthesia: General

## 2024-01-31 MED ORDER — LIDOCAINE HCL (CARDIAC) PF 100 MG/5ML IV SOSY
PREFILLED_SYRINGE | INTRAVENOUS | Status: DC | PRN
  Administered 2024-01-31: 60 mg via INTRAVENOUS

## 2024-01-31 MED ORDER — SODIUM CHLORIDE 0.9 % IV SOLN: INTRAVENOUS | Status: DC

## 2024-01-31 MED ORDER — PROPOFOL 500 MG/50ML IV EMUL
INTRAVENOUS | Status: DC | PRN
  Administered 2024-01-31: 120 ug/kg/min via INTRAVENOUS

## 2024-01-31 MED ORDER — PROPOFOL 10 MG/ML IV BOLUS
INTRAVENOUS | Status: DC | PRN
  Administered 2024-01-31: 60 mg via INTRAVENOUS

## 2024-01-31 NOTE — Anesthesia Postprocedure Evaluation (Signed)
 Anesthesia Post Note  Patient: Allison Shelton  Procedure(s) Performed: COLONOSCOPY WITH PROPOFOL POLYPECTOMY, INTESTINE  Patient location during evaluation: Endoscopy Anesthesia Type: General Level of consciousness: awake and alert Pain management: pain level controlled Vital Signs Assessment: post-procedure vital signs reviewed and stable Respiratory status: spontaneous breathing, nonlabored ventilation, respiratory function stable and patient connected to nasal cannula oxygen Cardiovascular status: blood pressure returned to baseline and stable Postop Assessment: no apparent nausea or vomiting Anesthetic complications: no   No notable events documented.   Last Vitals:  Vitals:   01/31/24 1345 01/31/24 1354  BP: 124/71 130/77  Pulse: 83 72  Resp: 16 18  Temp:    SpO2: 100% 100%    Last Pain:  Vitals:   01/31/24 1354  TempSrc:   PainSc: 0-No pain                 Lenard Simmer

## 2024-01-31 NOTE — Op Note (Signed)
 [facility] Gastroenterology Patient Name: Allison Shelton Procedure Date: 01/31/2024 1:05 PM MRN: [id number] Account #: [id number] Date of Birth: Dec 13, 1950 Admit Type: Outpatient Age: 73 Room: [facility] ENDO ROOM 3 Gender: Female Note Status: Finalized Instrument Name: Prentice Docker [id number] Procedure:             Colonoscopy Indications:           High risk colon cancer surveillance: Personal history                         of colonic polyps, Last colonoscopy 5 years ago Providers:             Eather Colas MD, MD Medicines:             Monitored Anesthesia Care Complications:         No immediate complications. Estimated blood loss:                         Minimal. Procedure:             Pre-Anesthesia Assessment:                        - Prior to the procedure, a History and Physical was                         performed, and patient medications and allergies were                         reviewed. The patient is competent. The risks and                         benefits of the procedure and the sedation options and                         risks were discussed with the patient. All questions                         were answered and informed consent was obtained.                         Patient identification and proposed procedure were                         verified by the physician, the nurse, the                         anesthesiologist, the anesthetist and the technician                         in the endoscopy suite. Mental Status Examination:                         alert and oriented. Airway Examination: normal                         oropharyngeal airway and neck mobility. Respiratory                         Examination: clear to auscultation. CV Examination:  normal. Prophylactic Antibiotics: The patient does not                         require prophylactic antibiotics. Prior                         Anticoagulants: The patient has taken  no anticoagulant                         or antiplatelet agents. ASA Grade Assessment: II - A                         patient with mild systemic disease. After reviewing                         the risks and benefits, the patient was deemed in                         satisfactory condition to undergo the procedure. The                         anesthesia plan was to use monitored anesthesia care                         (MAC). Immediately prior to administration of                         medications, the patient was re-assessed for adequacy                         to receive sedatives. The heart rate, respiratory                         rate, oxygen saturations, blood pressure, adequacy of                         pulmonary ventilation, and response to care were                         monitored throughout the procedure. The physical                         status of the patient was re-assessed after the                         procedure.                        After obtaining informed consent, the colonoscope was                         passed under direct vision. Throughout the procedure,                         the patient's blood pressure, pulse, and oxygen                         saturations were monitored continuously. The  Colonoscope was introduced through the anus and                         advanced to the the cecum, identified by appendiceal                         orifice and ileocecal valve. The colonoscopy was                         performed without difficulty. The patient tolerated                         the procedure well. The quality of the bowel                         preparation was good. The ileocecal valve, appendiceal                         orifice, and rectum were photographed. Findings:      The perianal and digital rectal examinations were normal.      A less than 1 mm polyp was found in the cecum. The polyp was sessile.       The polyp  was removed with a jumbo cold forceps. Resection and retrieval       were complete. Estimated blood loss was minimal.      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      Scattered small-mouthed diverticula were found in the sigmoid colon,       descending colon and distal transverse colon.      Internal hemorrhoids were found during retroflexion. The hemorrhoids       were Grade I (internal hemorrhoids that do not prolapse).      The exam was otherwise without abnormality on direct and retroflexion       views. Impression:            - One less than 1 mm polyp in the cecum, removed with                         a jumbo cold forceps. Resected and retrieved.                        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                        - Diverticulosis in the sigmoid colon, in the                         descending colon and in the distal transverse colon.                        - Internal hemorrhoids.                        - The examination was otherwise normal on direct and                         retroflexion views. Recommendation:        - Discharge patient to home.                        -  Resume previous diet.                        - Continue present medications.                        - Await pathology results.                        - Repeat colonoscopy is not recommended due to current                         age (60 years or older) for surveillance.                        - Return to referring physician as previously                         scheduled. Procedure Code(s):     --- Professional ---                        [phone], Colonoscopy, flexible; with biopsy, single or                         multiple Diagnosis Code(s):     --- Professional ---                        Z86.010, Personal history of colonic polyps                        D12.0, Benign neoplasm of cecum                        D17.5, Benign lipomatous neoplasm of intra-abdominal                          organs                        K64.0, First degree hemorrhoids                        K57.30, Diverticulosis of large intestine without                         perforation or abscess without bleeding CPT copyright 2022 American Medical Association. All rights reserved. The codes documented in this report are preliminary and upon coder review may  be revised to meet current compliance requirements. Eather Colas MD, MD 01/31/2024 1:47:41 PM Number of Addenda: 0 Note Initiated On: 01/31/2024 1:05 PM Scope Withdrawal Time: 0 hours 8 minutes 44 seconds  Total Procedure Duration: 0 hours 12 minutes 38 seconds  Estimated Blood Loss:  Estimated blood loss was minimal.      [facility]

## 2024-01-31 NOTE — Anesthesia Preprocedure Evaluation (Addendum)
 Anesthesia Evaluation  Patient identified by MRN, date of birth, ID band Patient awake    Reviewed: Allergy & Precautions, H&P , NPO status , Patient's Chart, lab work & pertinent test results  History of Anesthesia Complications Negative for: history of anesthetic complications  Airway Mallampati: I  TM Distance: <3 FB Neck ROM: Full    Dental  (+) Dental Advidsory Given, Caps, Missing   Pulmonary neg shortness of breath, asthma , neg COPD, neg recent URI   Pulmonary exam normal breath sounds clear to auscultation       Cardiovascular Exercise Tolerance: Good hypertension, (-) angina (-) Past MI and (-) Cardiac Stents Normal cardiovascular exam+ dysrhythmias (-) Valvular Problems/Murmurs Rhythm:Regular Rate:Normal     Neuro/Psych HOH; vertigo negative neurological ROS  negative psych ROS   GI/Hepatic negative GI ROS, Neg liver ROS,,,  Endo/Other  negative endocrine ROS  Obesity   Renal/GU negative Renal ROS  negative genitourinary   Musculoskeletal negative musculoskeletal ROS (+)    Abdominal   Peds negative pediatric ROS (+)  Hematology negative hematology ROS (+)   Anesthesia Other Findings Vertigo  Hypertension Deaf, right  Wears contact lenses Asthma  Hx of squamous cell carcinoma Actinic keratosis  RBBB (right bundle branch block)    Reproductive/Obstetrics negative OB ROS                             Anesthesia Physical Anesthesia Plan  ASA: 2  Anesthesia Plan: General   Post-op Pain Management:    Induction: Intravenous  PONV Risk Score and Plan: 3 and Propofol infusion, TIVA and Treatment may vary due to age or medical condition  Airway Management Planned: Natural Airway and Nasal Cannula  Additional Equipment:   Intra-op Plan:   Post-operative Plan:   Informed Consent: I have reviewed the patients History and Physical, chart, labs and discussed the  procedure including the risks, benefits and alternatives for the proposed anesthesia with the patient or authorized representative who has indicated his/her understanding and acceptance.     Dental Advisory Given  Plan Discussed with: Anesthesiologist, CRNA and Surgeon  Anesthesia Plan Comments: (Patient consented for risks of anesthesia including but not limited to:  - adverse reactions to medications - damage to eyes, teeth, lips or other oral mucosa - nerve damage due to positioning  - sore throat or hoarseness - Damage to heart, brain, nerves, lungs, other parts of body or loss of life  Patient voiced understanding.)       Anesthesia Quick Evaluation

## 2024-01-31 NOTE — Transfer of Care (Signed)
 Immediate Anesthesia Transfer of Care Note  Patient: Allison Shelton  Procedure(s) Performed: COLONOSCOPY WITH PROPOFOL POLYPECTOMY, INTESTINE  Patient Location: PACU and Endoscopy Unit  Anesthesia Type:General  Level of Consciousness: awake  Airway & Oxygen Therapy: Patient Spontanous Breathing  Post-op Assessment: Report given to RN and Post -op Vital signs reviewed and stable  Post vital signs: Reviewed and stable  Last Vitals:  Vitals Value Taken Time  BP 124/71 01/31/24 1345  Temp 36.1 C 01/31/24 1344  Pulse 81 01/31/24 1345  Resp 17 01/31/24 1345  SpO2 99 % 01/31/24 1345  Vitals shown include unfiled device data.  Last Pain:  Vitals:   01/31/24 1344  TempSrc:   PainSc: 0-No pain         Complications: No notable events documented.

## 2024-01-31 NOTE — Interval H&P Note (Signed)
 History and Physical Interval Note:  01/31/2024 1:20 PM  Allison Shelton  has presented today for surgery, with the diagnosis of Z86.0101 (ICD-10-CM) - Hx of adenomatous colonic polyps.  The various methods of treatment have been discussed with the patient and family. After consideration of risks, benefits and other options for treatment, the patient has consented to  Procedure(s): COLONOSCOPY WITH PROPOFOL (N/A) as a surgical intervention.  The patient's history has been reviewed, patient examined, no change in status, stable for surgery.  I have reviewed the patient's chart and labs.  Questions were answered to the patient's satisfaction.     Regis Bill  Ok to proceed with colonoscopy

## 2024-01-31 NOTE — H&P (Signed)
 Outpatient short stay form Pre-procedure 01/31/2024  Regis Bill, MD  Primary Physician: Danella Penton, MD  Reason for visit:  Surveillance colonoscopy  History of present illness:    73 y/o lady with history of hypertension and hyperlipidemia here for surveillance colonoscopy. Last colonoscopy in 2019 with lipoma. No blood thinners. No family history of GI malignancies. History of c-sections.    Current Facility-Administered Medications:    0.9 %  sodium chloride infusion, , Intravenous, Continuous, Suzzanne Brunkhorst, Rossie Muskrat, MD, Last Rate: 20 mL/hr at 01/31/24 1253, New Bag at 01/31/24 1253  Medications Prior to Admission  Medication Sig Dispense Refill Last Dose/Taking   Cholecalciferol 2000 units TABS Take by mouth 2 (two) times daily.   01/30/2024   cyanocobalamin (VITAMIN B12) 1000 MCG tablet Take 1,000 mcg by mouth once a week. Twice a week   01/30/2024   fluticasone (FLONASE) 50 MCG/ACT nasal spray Place into both nostrils daily.   01/30/2024   furosemide (LASIX) 20 MG tablet Take 20 mg by mouth daily.   01/30/2024   loratadine (CLARITIN) 10 MG tablet Take 10 mg by mouth daily.   01/30/2024   montelukast (SINGULAIR) 10 MG tablet    Past Week   potassium chloride (K-DUR) 10 MEQ tablet Take 10 mEq by mouth daily.   01/30/2024   telmisartan (MICARDIS) 80 MG tablet Take 80 mg by mouth daily.   01/30/2024   ondansetron (ZOFRAN-ODT) 4 MG disintegrating tablet Take 1 tablet (4 mg total) by mouth every 8 (eight) hours as needed for nausea or vomiting. (Patient not taking: Reported on 06/10/2023) 20 tablet 0    triamcinolone cream (KENALOG) 0.1 % Apply 1 application topically as directed. 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    Physical Exam  Gen: Alert, oriented. Appears stated age.  HEENT: PERRLA. Lungs: No respiratory distress CV: RRR Abd: soft, benign, no masses Ext: No edema    Planned procedures: Proceed with colonoscopy. The patient understands the nature of the planned procedure, indications, risks, alternatives and potential complications including but not limited to bleeding, infection, perforation, damage to internal organs and possible oversedation/side effects from anesthesia. The patient agrees and gives consent to proceed.  Please refer to procedure notes for findings, recommendations and patient disposition/instructions.     Regis Bill, MD [facility] Gastroenterology

## 2024-02-03 ENCOUNTER — Encounter: Payer: Self-pay | Admitting: Gastroenterology

## 2024-02-03 LAB — SURGICAL PATHOLOGY

## 2024-03-30 DIAGNOSIS — E538 Deficiency of other specified B group vitamins: Secondary | ICD-10-CM | POA: Diagnosis not present

## 2024-03-30 DIAGNOSIS — R739 Hyperglycemia, unspecified: Secondary | ICD-10-CM | POA: Diagnosis not present

## 2024-03-30 DIAGNOSIS — E782 Mixed hyperlipidemia: Secondary | ICD-10-CM | POA: Diagnosis not present

## 2024-04-06 DIAGNOSIS — Z0000 Encounter for general adult medical examination without abnormal findings: Secondary | ICD-10-CM | POA: Diagnosis not present

## 2024-04-06 DIAGNOSIS — E782 Mixed hyperlipidemia: Secondary | ICD-10-CM | POA: Diagnosis not present

## 2024-04-06 DIAGNOSIS — E538 Deficiency of other specified B group vitamins: Secondary | ICD-10-CM | POA: Diagnosis not present

## 2024-04-06 DIAGNOSIS — Z1231 Encounter for screening mammogram for malignant neoplasm of breast: Secondary | ICD-10-CM | POA: Diagnosis not present

## 2024-04-06 DIAGNOSIS — R739 Hyperglycemia, unspecified: Secondary | ICD-10-CM | POA: Diagnosis not present

## 2024-04-06 DIAGNOSIS — Z6835 Body mass index (BMI) 35.0-35.9, adult: Secondary | ICD-10-CM | POA: Diagnosis not present

## 2024-06-01 DIAGNOSIS — H43813 Vitreous degeneration, bilateral: Secondary | ICD-10-CM | POA: Diagnosis not present

## 2024-06-01 DIAGNOSIS — H2513 Age-related nuclear cataract, bilateral: Secondary | ICD-10-CM | POA: Diagnosis not present

## 2024-06-17 ENCOUNTER — Ambulatory Visit: Payer: PPO | Admitting: Dermatology

## 2024-06-30 ENCOUNTER — Ambulatory Visit: Admitting: Dermatology

## 2024-06-30 ENCOUNTER — Encounter: Payer: Self-pay | Admitting: Dermatology

## 2024-06-30 DIAGNOSIS — D229 Melanocytic nevi, unspecified: Secondary | ICD-10-CM

## 2024-06-30 DIAGNOSIS — L814 Other melanin hyperpigmentation: Secondary | ICD-10-CM

## 2024-06-30 DIAGNOSIS — L578 Other skin changes due to chronic exposure to nonionizing radiation: Secondary | ICD-10-CM

## 2024-06-30 DIAGNOSIS — L820 Inflamed seborrheic keratosis: Secondary | ICD-10-CM | POA: Diagnosis not present

## 2024-06-30 DIAGNOSIS — W908XXA Exposure to other nonionizing radiation, initial encounter: Secondary | ICD-10-CM | POA: Diagnosis not present

## 2024-06-30 DIAGNOSIS — Z1283 Encounter for screening for malignant neoplasm of skin: Secondary | ICD-10-CM | POA: Diagnosis not present

## 2024-06-30 DIAGNOSIS — D1801 Hemangioma of skin and subcutaneous tissue: Secondary | ICD-10-CM | POA: Diagnosis not present

## 2024-06-30 DIAGNOSIS — L821 Other seborrheic keratosis: Secondary | ICD-10-CM | POA: Diagnosis not present

## 2024-06-30 NOTE — Progress Notes (Signed)
   Follow-Up Visit   Subjective  Allison Shelton is a 73 y.o. female who presents for the following: Skin Cancer Screening and Full Body Skin Exam Hx of Actinic Keratosis   The patient presents for Total-Body Skin Exam (TBSE) for skin cancer screening and mole check. The patient has spots, moles and lesions to be evaluated, some may be new or changing and the patient may have concern these could be cancer.  The following portions of the chart were reviewed this encounter and updated as appropriate: medications, allergies, medical history  Review of Systems:  No other skin or systemic complaints except as noted in HPI or Assessment and Plan.  Objective  Well appearing patient in no apparent distress; mood and affect are within normal limits.  A full examination was performed including scalp, head, eyes, ears, nose, lips, neck, chest, axillae, abdomen, back, buttocks, bilateral upper extremities, bilateral lower extremities, hands, feet, fingers, toes, fingernails, and toenails. All findings within normal limits unless otherwise noted below.   Relevant physical exam findings are noted in the Assessment and Plan.  Mid Back Stuck-on, waxy, tan-brown papule or plaque --Discussed benign etiology and prognosis.   Assessment & Plan   SKIN CANCER SCREENING PERFORMED TODAY.  ACTINIC DAMAGE - Chronic condition, secondary to cumulative UV/sun exposure - diffuse scaly erythematous macules with underlying dyspigmentation - Recommend daily broad spectrum sunscreen SPF 30+ to sun-exposed areas, reapply every 2 hours as needed.  - Staying in the shade or wearing long sleeves, sun glasses (UVA+UVB protection) and wide brim hats (4-inch brim around the entire circumference of the hat) are also recommended for sun protection.  - Call for new or changing lesions.  LENTIGINES, SEBORRHEIC KERATOSES, HEMANGIOMAS - Benign normal skin lesions - Benign-appearing - Call for any changes  MELANOCYTIC NEVI -  Tan-brown and/or pink-flesh-colored symmetric macules and papules - Benign appearing on exam today - Observation - Call clinic for new or changing moles - Recommend daily use of broad spectrum spf 30+ sunscreen to sun-exposed areas.   INFLAMED SEBORRHEIC KERATOSIS Mid Back Destruction of lesion - Mid Back Complexity: simple   Destruction method: cryotherapy   Informed consent: discussed and consent obtained   Timeout:  patient name, date of birth, surgical site, and procedure verified Lesion destroyed using liquid nitrogen: Yes   Region frozen until ice ball extended beyond lesion: Yes   Outcome: patient tolerated procedure well with no complications   Post-procedure details: wound care instructions given    Return in about 1 year (around 06/30/2025) for tbse.  I, Gordan Beams, CMA, am acting as scribe for Alm Rhyme, MD.   Documentation: I have reviewed the above documentation for accuracy and completeness, and I agree with the above.  Alm Rhyme, MD

## 2025-07-06 ENCOUNTER — Ambulatory Visit: Admitting: Dermatology
